# Patient Record
Sex: Female | Born: 1975
Health system: Southern US, Community
[De-identification: ages and names within clinical notes are randomized; demographics above are authoritative.]

## PROBLEM LIST (undated history)

## (undated) DIAGNOSIS — D219 Benign neoplasm of connective and other soft tissue, unspecified: Secondary | ICD-10-CM

## (undated) DIAGNOSIS — D649 Anemia, unspecified: Secondary | ICD-10-CM

---

## 2005-04-30 ENCOUNTER — Other Ambulatory Visit: Admission: RE | Admit: 2005-04-30 | Discharge: 2005-04-30 | Payer: Self-pay | Admitting: Family Medicine

## 2008-08-04 ENCOUNTER — Other Ambulatory Visit: Admission: RE | Admit: 2008-08-04 | Discharge: 2008-08-04 | Payer: Self-pay | Admitting: Obstetrics and Gynecology

## 2008-10-18 ENCOUNTER — Inpatient Hospital Stay (HOSPITAL_COMMUNITY): Admission: AD | Admit: 2008-10-18 | Discharge: 2008-10-18 | Payer: Self-pay | Admitting: Obstetrics and Gynecology

## 2009-01-20 ENCOUNTER — Inpatient Hospital Stay (HOSPITAL_COMMUNITY): Admission: AD | Admit: 2009-01-20 | Discharge: 2009-01-20 | Payer: Self-pay | Admitting: Obstetrics and Gynecology

## 2009-01-21 ENCOUNTER — Inpatient Hospital Stay (HOSPITAL_COMMUNITY): Admission: AD | Admit: 2009-01-21 | Discharge: 2009-01-25 | Payer: Self-pay | Admitting: Obstetrics and Gynecology

## 2009-01-22 ENCOUNTER — Encounter (INDEPENDENT_AMBULATORY_CARE_PROVIDER_SITE_OTHER): Payer: Self-pay | Admitting: Obstetrics and Gynecology

## 2010-09-30 LAB — CBC
MCV: 95.9 fL (ref 78.0–100.0)
Platelets: 148 10*3/uL — ABNORMAL LOW (ref 150–400)
WBC: 17.3 10*3/uL — ABNORMAL HIGH (ref 4.0–10.5)

## 2010-10-01 LAB — COMPREHENSIVE METABOLIC PANEL
BUN: 11 mg/dL (ref 6–23)
Calcium: 9.4 mg/dL (ref 8.4–10.5)
Glucose, Bld: 89 mg/dL (ref 70–99)
Total Protein: 6.1 g/dL (ref 6.0–8.3)

## 2010-10-01 LAB — URINALYSIS, ROUTINE W REFLEX MICROSCOPIC
Ketones, ur: NEGATIVE mg/dL
Nitrite: NEGATIVE
Protein, ur: NEGATIVE mg/dL
pH: 5 (ref 5.0–8.0)

## 2010-10-01 LAB — RPR: RPR Ser Ql: NONREACTIVE

## 2010-10-01 LAB — CBC
HCT: 39.3 % (ref 36.0–46.0)
Hemoglobin: 13.4 g/dL (ref 12.0–15.0)
MCHC: 34.3 g/dL (ref 30.0–36.0)
MCV: 96 fL (ref 78.0–100.0)
RDW: 12.1 % (ref 11.5–15.5)

## 2010-10-01 LAB — LACTATE DEHYDROGENASE: LDH: 190 U/L (ref 94–250)

## 2010-10-04 LAB — URINALYSIS, ROUTINE W REFLEX MICROSCOPIC
Bilirubin Urine: NEGATIVE
Glucose, UA: NEGATIVE mg/dL
Nitrite: NEGATIVE
pH: 6 (ref 5.0–8.0)

## 2010-10-04 LAB — GC/CHLAMYDIA PROBE AMP, GENITAL: Chlamydia, DNA Probe: NEGATIVE

## 2010-10-04 LAB — WET PREP, GENITAL
Trich, Wet Prep: NONE SEEN
Yeast Wet Prep HPF POC: NONE SEEN

## 2010-11-07 NOTE — Op Note (Signed)
NAME:  Tamara Hamilton, Tamara Hamilton NO.:  [id number]   MEDICAL RECORD NO.:  [id number]          PATIENT TYPE:  INP   LOCATION:  9132                          FACILITY:  [facility]   PHYSICIAN:  Gerald Leitz, MD          DATE OF BIRTH:  1975-07-03   DATE OF PROCEDURE:  01/22/2009  DATE OF DISCHARGE:                               OPERATIVE REPORT   PREOPERATIVE DIAGNOSES:  1. 37-week intrauterine pregnancy.  2. Term labor.  3. Failure to progress.   POSTOPERATIVE DIAGNOSES:  1. 37-week intrauterine pregnancy.  2. Term labor.  3. Failure to progress.   PROCEDURE:  Primary low transverse cesarean section.   SURGEON:  Gerald Leitz, MD   ASSISTANT:  None.   ANESTHESIA:  Epidural.   FINDINGS:  Vigorous female infant, cephalic presentation, Apgars per  neonatologist.  Weight 7 pounds 1 ounce.   SPECIMEN:  Placenta.   DISPOSITION OF SPECIMEN:  Pathology.   ESTIMATED BLOOD LOSS:  200 mL.   COMPLICATIONS:  None.   URINE OUTPUT:  Per Anesthesia.   FLUIDS:  Per Anesthesia.   INDICATIONS:  A 35 year old G1 at 37-week intrauterine pregnancy in term  labor with arrest of dilatation at 7-8 cm.   DESCRIPTION OF PROCEDURE:  The patient was taken to the operating room.  Her epidural was found to be adequate.  She was prepped and draped in  the usual sterile fashion.  A Pfannenstiel skin incision was made with  scalpel, carried down to the underlying layer of fascia.  Fascia was  incised in midline and extended laterally with Mayo scissors.  Superior  aspect of fascial incision was elevated with Kocher clamps and  underlying rectus muscles were dissected off.  This was repeated on the  inferior aspect of the fascial incision.  The rectus muscle was  separated in the midline.  The peritoneum was identified and entered  bluntly.  The Alexis retractor was placed.  Vesicouterine peritoneum was  tented up, entered sharply with Metzenbaum scissors.  This was extended  laterally and bladder flap  was created digitally.  Low transverse  incision was made with a scalpel and the infant's head was then  delivered atraumatically.  The mouth and nose were bulb suctioned.  The  rest of the body was delivered.  The cord was clamped x2 and cut.  The  infant was handed off to the awaiting neonatologist.  The placenta was  expressed.  The uterus was exteriorized, cleared of all clot and debris.  The uterine incision repaired with 0 Vicryl in a running locked fashion.  A second layer of suture was used for hemostasis.  The patient also  noted to have extensions of the low transverse incision into the lateral  aspect of the lower uterine segment.  This was also repaired with 0  Vicryl in a running locked fashion.  Excellent hemostasis was noted.  The uterus was returned to the abdomen.  The abdomen was copiously  irrigated, again hemostasis was assured.  The Alexis retractor was  removed.  The peritoneum was closed with 2-0  Vicryl.  The fascia was  closed with 0 PDS in a running fashion and the skin was closed with  staples.  Sponge, lap, and needle counts were correct x2.  900 mg of  clindamycin were given.  A cord was clamped.  The patient was returned  to the recovery room awake and stable condition.      Gerald Leitz, MD  Electronically Signed     TC/MEDQ  D:  01/22/2009  T:  01/23/2009  Job:  [id number]

## 2010-11-10 NOTE — Discharge Summary (Signed)
NAME:  Tamara Hamilton, Tamara Hamilton NO.:  [id number]   MEDICAL RECORD NO.:  [id number]          PATIENT TYPE:  INP   LOCATION:  9132                          FACILITY:  [facility]   PHYSICIAN:  Charles A. Delcambre, MDDATE OF BIRTH:  [birth year]   DATE OF ADMISSION:  01/21/2009  DATE OF DISCHARGE:  01/25/2009                               DISCHARGE SUMMARY   DISCHARGE DIAGNOSES:  1. A 37-week pregnancy.  2. Term labor.  3. Failure to progress with arrest of labor.   PROCEDURE:  Primary low transverse cesarean section.   OPERATIVE FINDINGS:  Vigorous female, Apgars noted to be per  neonatologist.  Apgars were 9 and 9.   DISPOSITION:  The patient is discharged home to follow up in the office  in 1-day to discontinue staples.  She was given convalescent  instructions to notify temperature greater than 100 degrees, increased  bleeding, erythema or drainage about the incision.  She was instructed  to not lift greater than 25 pounds for 1 month, drive for 2 weeks or  bathe for 2 weeks where she can shower.  She is given prescriptions for  Percocet 5/325 one to two p.o. q.4 h. p.r.n. #40 and Motrin 800 mg one  p.o. q.8 h. p.r.n. #30, refill x1.   HISTORY AND PHYSICAL:  Dictated on the chart.   LABORATORY:  Postoperative hematocrit 30.2 and hemoglobin 10.5.   HOSPITAL COURSE:  The patient was admitted and noted to labor.  She did  labor to arrest of dilation at 7-8 cm.  She was taken for cesarean  section postop day #1.  She was doing well and has spontaneous return of  flatus.  Postop day #2 she had good pain control and was tolerating  pain.  She voided and had flatus and was tolerating general diet,  therefore she was discharged on postop day #3 as instructed above.      Charles A. Sydnee Cabal, MD  Electronically Signed     CAD/MEDQ  D:  02/19/2009  T:  02/20/2009  Job:  [id number]

## 2014-12-06 LAB — OB RESULTS CONSOLE ANTIBODY SCREEN: ANTIBODY SCREEN: NEGATIVE

## 2014-12-06 LAB — OB RESULTS CONSOLE GC/CHLAMYDIA
CHLAMYDIA, DNA PROBE: NEGATIVE
Gonorrhea: NEGATIVE

## 2014-12-06 LAB — OB RESULTS CONSOLE PLATELET COUNT: Platelets: 220 10*3/uL

## 2014-12-06 LAB — OB RESULTS CONSOLE HGB/HCT, BLOOD
HCT: 40 %
Hemoglobin: 13.1 g/dL

## 2014-12-06 LAB — OB RESULTS CONSOLE RUBELLA ANTIBODY, IGM: RUBELLA: IMMUNE

## 2014-12-06 LAB — OB RESULTS CONSOLE ABO/RH: RH Type: POSITIVE

## 2014-12-06 LAB — OB RESULTS CONSOLE RPR: RPR: NONREACTIVE

## 2014-12-06 LAB — OB RESULTS CONSOLE HIV ANTIBODY (ROUTINE TESTING): HIV: NONREACTIVE

## 2014-12-06 LAB — OB RESULTS CONSOLE HEPATITIS B SURFACE ANTIGEN: HEP B S AG: NEGATIVE

## 2015-05-11 ENCOUNTER — Inpatient Hospital Stay (HOSPITAL_COMMUNITY): Payer: Medicaid Other | Admitting: Anesthesiology

## 2015-05-11 ENCOUNTER — Inpatient Hospital Stay (HOSPITAL_COMMUNITY): Payer: Medicaid Other

## 2015-05-11 ENCOUNTER — Inpatient Hospital Stay (HOSPITAL_COMMUNITY)
Admission: AD | Admit: 2015-05-11 | Discharge: 2015-05-13 | DRG: 765 | Disposition: A | Discharge status: Home / Self Care | Payer: Medicaid Other | Source: Ambulatory Visit | Attending: Obstetrics and Gynecology | Admitting: Obstetrics and Gynecology

## 2015-05-11 ENCOUNTER — Encounter (HOSPITAL_COMMUNITY): Payer: Self-pay | Admitting: Student

## 2015-05-11 ENCOUNTER — Encounter (HOSPITAL_COMMUNITY)
Admission: AD | Disposition: A | Discharge status: Home / Self Care | Payer: Self-pay | Source: Ambulatory Visit | Attending: Obstetrics and Gynecology

## 2015-05-11 DIAGNOSIS — O34211 Maternal care for low transverse scar from previous cesarean delivery: Principal | ICD-10-CM | POA: Diagnosis present

## 2015-05-11 DIAGNOSIS — O09523 Supervision of elderly multigravida, third trimester: Secondary | ICD-10-CM

## 2015-05-11 DIAGNOSIS — O479 False labor, unspecified: Secondary | ICD-10-CM

## 2015-05-11 DIAGNOSIS — Z3A36 36 weeks gestation of pregnancy: Secondary | ICD-10-CM

## 2015-05-11 DIAGNOSIS — O4700 False labor before 37 completed weeks of gestation, unspecified trimester: Secondary | ICD-10-CM

## 2015-05-11 DIAGNOSIS — Z98891 History of uterine scar from previous surgery: Secondary | ICD-10-CM

## 2015-05-11 HISTORY — DX: Benign neoplasm of connective and other soft tissue, unspecified: D21.9

## 2015-05-11 HISTORY — DX: Anemia, unspecified: D64.9

## 2015-05-11 LAB — GROUP B STREP BY PCR: Group B strep by PCR: NEGATIVE

## 2015-05-11 LAB — RPR: RPR Ser Ql: NONREACTIVE

## 2015-05-11 LAB — TYPE AND SCREEN
ABO/RH(D): O POS
ANTIBODY SCREEN: NEGATIVE

## 2015-05-11 LAB — OB RESULTS CONSOLE GBS: STREP GROUP B AG: NEGATIVE

## 2015-05-11 LAB — CBC
HCT: 38 % (ref 36.0–46.0)
Hemoglobin: 13.1 g/dL (ref 12.0–15.0)
MCH: 32.5 pg (ref 26.0–34.0)
MCHC: 34.5 g/dL (ref 30.0–36.0)
MCV: 94.3 fL (ref 78.0–100.0)
PLATELETS: 177 10*3/uL (ref 150–400)
RBC: 4.03 MIL/uL (ref 3.87–5.11)
RDW: 13.2 % (ref 11.5–15.5)
WBC: 12.7 10*3/uL — AB (ref 4.0–10.5)

## 2015-05-11 LAB — ABO/RH: ABO/RH(D): O POS

## 2015-05-11 LAB — HIV ANTIBODY (ROUTINE TESTING W REFLEX): HIV Screen 4th Generation wRfx: NONREACTIVE

## 2015-05-11 SURGERY — Surgical Case
Anesthesia: Spinal

## 2015-05-11 MED ORDER — NALOXONE HCL 2 MG/2ML IJ SOSY
1.0000 ug/kg/h | PREFILLED_SYRINGE | INTRAMUSCULAR | Status: DC | PRN
  Filled 2015-05-11: qty 2

## 2015-05-11 MED ORDER — NALBUPHINE HCL 10 MG/ML IJ SOLN: 5.0000 mg | Freq: Once | INTRAMUSCULAR | Status: DC | PRN

## 2015-05-11 MED ORDER — DIPHENHYDRAMINE HCL 50 MG/ML IJ SOLN: 12.5000 mg | INTRAMUSCULAR | Status: DC | PRN

## 2015-05-11 MED ORDER — SCOPOLAMINE 1 MG/3DAYS TD PT72
MEDICATED_PATCH | TRANSDERMAL | Status: DC | PRN
  Administered 2015-05-11: 1 via TRANSDERMAL

## 2015-05-11 MED ORDER — NIFEDIPINE 10 MG PO CAPS
20.0000 mg | ORAL_CAPSULE | Freq: Once | ORAL | Status: AC
  Administered 2015-05-11: 20 mg via ORAL

## 2015-05-11 MED ORDER — IBUPROFEN 600 MG PO TABS
600.0000 mg | ORAL_TABLET | Freq: Four times a day (QID) | ORAL | Status: DC
  Administered 2015-05-11 – 2015-05-13 (×8): 600 mg via ORAL
  Filled 2015-05-11 (×8): qty 1

## 2015-05-11 MED ORDER — WITCH HAZEL-GLYCERIN EX PADS: 1.0000 "application " | MEDICATED_PAD | CUTANEOUS | Status: DC | PRN

## 2015-05-11 MED ORDER — MENTHOL 3 MG MT LOZG: 1.0000 | LOZENGE | OROMUCOSAL | Status: DC | PRN

## 2015-05-11 MED ORDER — DIPHENHYDRAMINE HCL 25 MG PO CAPS
25.0000 mg | ORAL_CAPSULE | ORAL | Status: DC | PRN
  Filled 2015-05-11: qty 1

## 2015-05-11 MED ORDER — ONDANSETRON HCL 4 MG/2ML IJ SOLN
INTRAMUSCULAR | Status: DC | PRN
  Administered 2015-05-11: 4 mg via INTRAVENOUS

## 2015-05-11 MED ORDER — SENNOSIDES-DOCUSATE SODIUM 8.6-50 MG PO TABS
2.0000 | ORAL_TABLET | ORAL | Status: DC
  Administered 2015-05-11 – 2015-05-12 (×2): 2 via ORAL
  Filled 2015-05-11 (×2): qty 2

## 2015-05-11 MED ORDER — NIFEDIPINE 10 MG PO CAPS
10.0000 mg | ORAL_CAPSULE | Freq: Once | ORAL | Status: AC
  Administered 2015-05-11: 10 mg via ORAL

## 2015-05-11 MED ORDER — ACETAMINOPHEN 325 MG PO TABS: 650.0000 mg | ORAL_TABLET | ORAL | Status: DC | PRN

## 2015-05-11 MED ORDER — ACETAMINOPHEN 500 MG PO TABS
1000.0000 mg | ORAL_TABLET | Freq: Four times a day (QID) | ORAL | Status: AC
Start: 2015-05-11 — End: 2015-05-12
  Administered 2015-05-11 – 2015-05-12 (×3): 1000 mg via ORAL
  Filled 2015-05-11 (×3): qty 2

## 2015-05-11 MED ORDER — ONDANSETRON HCL 4 MG/2ML IJ SOLN: 4.0000 mg | Freq: Once | INTRAMUSCULAR | Status: DC | PRN

## 2015-05-11 MED ORDER — NALOXONE HCL 0.4 MG/ML IJ SOLN
0.4000 mg | INTRAMUSCULAR | Status: DC | PRN
Start: 2015-05-11 — End: 2015-05-13

## 2015-05-11 MED ORDER — SCOPOLAMINE 1 MG/3DAYS TD PT72: 1.0000 | MEDICATED_PATCH | Freq: Once | TRANSDERMAL | Status: DC

## 2015-05-11 MED ORDER — CEFAZOLIN SODIUM-DEXTROSE 2-3 GM-% IV SOLR
INTRAVENOUS | Status: DC | PRN
  Administered 2015-05-11: 2 g via INTRAVENOUS

## 2015-05-11 MED ORDER — MORPHINE SULFATE (PF) 0.5 MG/ML IJ SOLN
INTRAMUSCULAR | Status: DC | PRN
  Administered 2015-05-11: .2 mg via INTRATHECAL

## 2015-05-11 MED ORDER — NALBUPHINE HCL 10 MG/ML IJ SOLN: 5.0000 mg | INTRAMUSCULAR | Status: DC | PRN

## 2015-05-11 MED ORDER — CITRIC ACID-SODIUM CITRATE 334-500 MG/5ML PO SOLN
30.0000 mL | Freq: Once | ORAL | Status: AC
  Administered 2015-05-11: 30 mL via ORAL
  Filled 2015-05-11: qty 15

## 2015-05-11 MED ORDER — LANOLIN HYDROUS EX OINT: 1.0000 "application " | TOPICAL_OINTMENT | CUTANEOUS | Status: DC | PRN

## 2015-05-11 MED ORDER — ZOLPIDEM TARTRATE 5 MG PO TABS: 5.0000 mg | ORAL_TABLET | Freq: Every evening | ORAL | Status: DC | PRN

## 2015-05-11 MED ORDER — TERBUTALINE SULFATE 1 MG/ML IJ SOLN: 0.2500 mg | Freq: Once | INTRAMUSCULAR | Status: DC

## 2015-05-11 MED ORDER — MAGNESIUM HYDROXIDE 400 MG/5ML PO SUSP
30.0000 mL | ORAL | Status: DC | PRN
Start: 2015-05-11 — End: 2015-05-13

## 2015-05-11 MED ORDER — FENTANYL CITRATE (PF) 100 MCG/2ML IJ SOLN: 25.0000 ug | INTRAMUSCULAR | Status: DC | PRN

## 2015-05-11 MED ORDER — LACTATED RINGERS IV SOLN
INTRAVENOUS | Status: DC
  Administered 2015-05-11: 16:00:00 via INTRAVENOUS

## 2015-05-11 MED ORDER — DIBUCAINE 1 % RE OINT: 1.0000 "application " | TOPICAL_OINTMENT | RECTAL | Status: DC | PRN

## 2015-05-11 MED ORDER — TETANUS-DIPHTH-ACELL PERTUSSIS 5-2.5-18.5 LF-MCG/0.5 IM SUSP: 0.5000 mL | Freq: Once | INTRAMUSCULAR | Status: DC

## 2015-05-11 MED ORDER — BUPIVACAINE IN DEXTROSE 0.75-8.25 % IT SOLN
INTRATHECAL | Status: DC | PRN
  Administered 2015-05-11: 1.6 mL via INTRATHECAL

## 2015-05-11 MED ORDER — OXYCODONE-ACETAMINOPHEN 5-325 MG PO TABS: 1.0000 | ORAL_TABLET | ORAL | Status: DC | PRN

## 2015-05-11 MED ORDER — FENTANYL CITRATE (PF) 100 MCG/2ML IJ SOLN
INTRAMUSCULAR | Status: DC | PRN
  Administered 2015-05-11: 10 ug via INTRATHECAL

## 2015-05-11 MED ORDER — PHENYLEPHRINE 8 MG IN D5W 100 ML (0.08MG/ML) PREMIX OPTIME
INJECTION | INTRAVENOUS | Status: DC | PRN
  Administered 2015-05-11: 60 ug/min via INTRAVENOUS

## 2015-05-11 MED ORDER — MEPERIDINE HCL 25 MG/ML IJ SOLN: 6.2500 mg | INTRAMUSCULAR | Status: DC | PRN

## 2015-05-11 MED ORDER — LACTATED RINGERS IV SOLN
INTRAVENOUS | Status: DC
  Administered 2015-05-11 (×2): via INTRAVENOUS

## 2015-05-11 MED ORDER — ONDANSETRON HCL 4 MG/2ML IJ SOLN: 4.0000 mg | Freq: Three times a day (TID) | INTRAMUSCULAR | Status: DC | PRN

## 2015-05-11 MED ORDER — SODIUM CHLORIDE 0.9 % IJ SOLN: 3.0000 mL | INTRAMUSCULAR | Status: DC | PRN

## 2015-05-11 MED ORDER — MEASLES, MUMPS & RUBELLA VAC ~~LOC~~ INJ
0.5000 mL | INJECTION | Freq: Once | SUBCUTANEOUS | Status: DC
  Filled 2015-05-11: qty 0.5

## 2015-05-11 MED ORDER — PRENATAL MULTIVITAMIN CH
1.0000 | ORAL_TABLET | Freq: Every day | ORAL | Status: DC
  Administered 2015-05-13: 1 via ORAL
  Filled 2015-05-11 (×2): qty 1

## 2015-05-11 MED ORDER — FAMOTIDINE IN NACL 20-0.9 MG/50ML-% IV SOLN
20.0000 mg | Freq: Once | INTRAVENOUS | Status: AC
  Administered 2015-05-11: 20 mg via INTRAVENOUS
  Filled 2015-05-11: qty 50

## 2015-05-11 MED ORDER — LACTATED RINGERS IV BOLUS (SEPSIS)
1000.0000 mL | Freq: Once | INTRAVENOUS | Status: AC
  Administered 2015-05-11: 1000 mL via INTRAVENOUS

## 2015-05-11 MED ORDER — BETAMETHASONE SOD PHOS & ACET 6 (3-3) MG/ML IJ SUSP
12.0000 mg | Freq: Once | INTRAMUSCULAR | Status: AC
  Administered 2015-05-11: 12 mg via INTRAMUSCULAR
  Filled 2015-05-11: qty 2

## 2015-05-11 MED ORDER — DIPHENHYDRAMINE HCL 25 MG PO CAPS: 25.0000 mg | ORAL_CAPSULE | Freq: Four times a day (QID) | ORAL | Status: DC | PRN

## 2015-05-11 MED ORDER — SIMETHICONE 80 MG PO CHEW
80.0000 mg | CHEWABLE_TABLET | ORAL | Status: DC | PRN
  Administered 2015-05-11 – 2015-05-12 (×2): 80 mg via ORAL
  Filled 2015-05-11 (×2): qty 1

## 2015-05-11 MED ORDER — LACTATED RINGERS IV SOLN
40.0000 [IU] | INTRAVENOUS | Status: DC | PRN
  Administered 2015-05-11: 40 [IU] via INTRAVENOUS

## 2015-05-11 MED ORDER — OXYTOCIN 40 UNITS IN LACTATED RINGERS INFUSION - SIMPLE MED: 62.5000 mL/h | INTRAVENOUS | Status: AC

## 2015-05-11 MED ORDER — LACTATED RINGERS IV SOLN
INTRAVENOUS | Status: DC | PRN
  Administered 2015-05-11 (×2): via INTRAVENOUS

## 2015-05-11 SURGICAL SUPPLY — 38 items
BENZOIN TINCTURE PRP APPL 2/3 (GAUZE/BANDAGES/DRESSINGS) ×4 IMPLANT
CLAMP CORD UMBIL (MISCELLANEOUS) IMPLANT
CLOSURE WOUND 1/2 X4 (GAUZE/BANDAGES/DRESSINGS) ×1
CLOTH BEACON ORANGE TIMEOUT ST (SAFETY) ×4 IMPLANT
CONTAINER PREFILL 10% NBF 15ML (MISCELLANEOUS) IMPLANT
DRAPE SHEET LG 3/4 BI-LAMINATE (DRAPES) IMPLANT
DRSG OPSITE POSTOP 4X10 (GAUZE/BANDAGES/DRESSINGS) ×4 IMPLANT
DURAPREP 26ML APPLICATOR (WOUND CARE) ×4 IMPLANT
ELECT REM PT RETURN 9FT ADLT (ELECTROSURGICAL) ×4
ELECTRODE REM PT RTRN 9FT ADLT (ELECTROSURGICAL) ×2 IMPLANT
EXTRACTOR VACUUM KIWI (MISCELLANEOUS) IMPLANT
EXTRACTOR VACUUM M CUP 4 TUBE (SUCTIONS) IMPLANT
EXTRACTOR VACUUM M CUP 4' TUBE (SUCTIONS)
GLOVE BIOGEL PI IND STRL 7.0 (GLOVE) ×2 IMPLANT
GLOVE BIOGEL PI INDICATOR 7.0 (GLOVE) ×2
GLOVE ORTHO TXT STRL SZ7.5 (GLOVE) ×4 IMPLANT
GOWN STRL REUS W/TWL LRG LVL3 (GOWN DISPOSABLE) ×8 IMPLANT
KIT ABG SYR 3ML LUER SLIP (SYRINGE) IMPLANT
NEEDLE HYPO 25X5/8 SAFETYGLIDE (NEEDLE) ×4 IMPLANT
NS IRRIG 1000ML POUR BTL (IV SOLUTION) ×4 IMPLANT
PACK C SECTION WH (CUSTOM PROCEDURE TRAY) ×4 IMPLANT
PAD OB MATERNITY 4.3X12.25 (PERSONAL CARE ITEMS) ×4 IMPLANT
PENCIL SMOKE EVAC W/HOLSTER (ELECTROSURGICAL) ×4 IMPLANT
RTRCTR C-SECT PINK 25CM LRG (MISCELLANEOUS) ×4 IMPLANT
STRIP CLOSURE SKIN 1/2X4 (GAUZE/BANDAGES/DRESSINGS) ×3 IMPLANT
SUT CHROMIC 1 CTX 36 (SUTURE) ×8 IMPLANT
SUT PLAIN 0 NONE (SUTURE) IMPLANT
SUT PLAIN 2 0 (SUTURE) ×2
SUT PLAIN 2 0 XLH (SUTURE) IMPLANT
SUT PLAIN ABS 2-0 CT1 27XMFL (SUTURE) ×2 IMPLANT
SUT VIC AB 0 CT1 27 (SUTURE) ×4
SUT VIC AB 0 CT1 27XBRD ANBCTR (SUTURE) ×4 IMPLANT
SUT VIC AB 2-0 CT1 (SUTURE) ×4 IMPLANT
SUT VIC AB 2-0 CT1 27 (SUTURE) ×2
SUT VIC AB 2-0 CT1 TAPERPNT 27 (SUTURE) ×2 IMPLANT
SUT VIC AB 4-0 KS 27 (SUTURE) IMPLANT
TOWEL OR 17X24 6PK STRL BLUE (TOWEL DISPOSABLE) ×4 IMPLANT
TRAY FOLEY CATH SILVER 14FR (SET/KITS/TRAYS/PACK) ×4 IMPLANT

## 2015-05-11 NOTE — Op Note (Signed)
Preoperative diagnosis: Intrauterine pregnancy at 36+ weeks, previous c-section, preterm labor Postoperative diagnosis: Same Procedure: Repeat low transverse cesarean section without extensions Surgeon: Cheri Fowler M.D. Assistant:  Anson Crofts, RNFA Anesthesia: Spinal  Findings: Patient had significant adhesions to the uterine fundus, I was unable to visualize tubes or ovaries.  She delivered a viable female infant with Apgars of 8 and 9 weight pending Estimated blood loss: 1500 cc Specimens: Placenta sent to labor and delivery Complications: None  Procedure in detail: The patient was taken to the operating room and placed in the sitting position. Dr. Jillyn Hidden instilled spinal anesthesia.  She was then placed in the dorsosupine position with left tilt. Abdomen was then prepped and draped in the usual sterile fashion, and a foley catheter was inserted. The level of her anesthesia was found to be adequate. Abdomen was entered via a standard Pfannenstiel incision through her previous scar.  She had significant adhesions to the uterine fundus, I was able to dissect and create a bladder flap for the uterine incision. A 4 cm transverse incision was then made in the lower uterine segment pushing the bladder inferior. Once the uterine cavity was entered the incision was extended digitally. The fetal vertex was grasped and delivered through the incision atraumatically. Mouth and nares were suctioned. The remainder of the infant then delivered atraumatically. Cord was doubly clamped and cut and the infant handed to the awaiting pediatric team. Cord blood was obtained. The placenta delivered manually. Uterus was wiped dry with clean lap pad and all clots and debris were removed. Uterine incision was inspected and found to be free of extensions, but there was bleeding lateral to the right angle from the uterine artery. Uterine incision was closed in 1 layer with running locking #1 Chromic, starting at each end and  meeting in the middle. Tubes and ovaries were unable to be inspected, nor tubal ligation performed, due to significant adhesions. Uterine incision was inspected and found to be hemostatic. Bleeding from serosal edges was controlled with electrocautery. Subfascial space was irrigated and made hemostatic with electrocautery  Fascia was closed in running fashion starting at both ends and meeting in the middle with 0 Vicryl. Subcutaneous tissue was then irrigated and made hemostatic with electrocautery, then closed with running 2-0 plain gut. Skin was closed with running 4-0 Vicryl subcuticular suture followed by steri-strips and a sterile dressing. Patient tolerated the procedure well and was taken to the recovery in stable condition. Counts were correct x2, she received Ancef 2 g IV at the beginning of the procedure and she had PAS hose on throughout the procedure.

## 2015-05-11 NOTE — Transfer of Care (Signed)
Immediate Anesthesia Transfer of Care Note  Patient: Tamara Hamilton  Procedure(s) Performed: Procedure(s) with comments: CESAREAN SECTION - Unable to perform BTL due to significaqnt scar tissue  Patient Location: PACU  Anesthesia Type:Spinal  Level of Consciousness: awake, alert  and oriented  Airway & Oxygen Therapy: Patient Spontanous Breathing  Post-op Assessment: Report given to RN  Post vital signs: Reviewed  Last Vitals:  Filed Vitals:   05/11/15 0924  BP: 117/46  Pulse: 102  Temp:   Resp:     Complications: No apparent anesthesia complications

## 2015-05-11 NOTE — H&P (Signed)
Tamara Hamilton is a 39 y.o. female, G2 P1001, EGA 36+ weeks with EDC 12-13 presenting for ctx.  Over the past several hours in MAU, she has continued to have ctx q 3-4 min despite 2 doses of PO Procardia, cervix 2-3 cm and has gradually effaced and now has BBOW.  Prenatal care complicated by previous LTCS, declines VBAC and is scheduled for repeat c-section at 39 weeks, wants BTL and has signed papers.  Maternal Medical History:  Reason for admission: Contractions.   Contractions: Frequency: regular.   Perceived severity is moderate.    Fetal activity: Perceived fetal activity is normal.    Prenatal complications: no prenatal complications   OB History    Gravida Para Term Preterm AB TAB SAB Ectopic Multiple Living   2 1 1  0 0 0 0 0 0 1     Past Medical History  Diagnosis Date  . Anemia   . Fibroid    Past Surgical History  Procedure Laterality Date  . Cesarean section     Family History: family history is not on file. Social History:  reports that she has never smoked. She does not have any smokeless tobacco history on file. She reports that she does not drink alcohol or use illicit drugs.   Prenatal Transfer Tool  Maternal Diabetes: No Genetic Screening: Declined Maternal Ultrasounds/Referrals: Normal Fetal Ultrasounds or other Referrals:  None Maternal Substance Abuse:  No Significant Maternal Medications:  None Significant Maternal Lab Results:  None Other Comments:  previous c-section in labor  Review of Systems  Respiratory: Negative.   Cardiovascular: Negative.     Dilation: 2.5 Effacement (%): 100 Station: -2 Exam by:: S. Carrera, RNC Blood pressure 107/45, pulse 97, temperature 98.2 F (36.8 C), temperature source Oral, resp. rate 18, height 5\' 7"  (1.702 m), weight 98.703 kg (217 lb 9.6 oz), SpO2 97 %. Maternal Exam:  Uterine Assessment: Contraction strength is moderate.  Contraction frequency is regular.   Abdomen: Patient reports no abdominal  tenderness. Surgical scars: low transverse.   Estimated fetal weight is 6 1/2 lbs.   Fetal presentation: vertex  Introitus: Normal vulva. Normal vagina.  Amniotic fluid character: not assessed.  Pelvis: adequate for delivery.   Cervix: Cervix evaluated by digital exam.     Fetal Exam Fetal Monitor Review: Mode: ultrasound.   Baseline rate: 140.  Variability: moderate (6-25 bpm).   Pattern: accelerations present and no decelerations.    Fetal State Assessment: Category I - tracings are normal.     Physical Exam  Vitals reviewed. Constitutional: She appears well-developed and well-nourished.  Cardiovascular: Normal rate, regular rhythm and normal heart sounds.   No murmur heard. Respiratory: Effort normal and breath sounds normal. No respiratory distress. She has no wheezes.  GI: Soft.    Prenatal labs: ABO, Rh:  O pos Antibody:  neg Rubella:  Immune RPR:   NR HBsAg:   Neg HIV:   NR GBS:   Not done  Assessment/Plan: IUP at 36+ weeks in early labor, previous c-section and declines VBAC.  Cervix has changed over the past few hours and ctx persist.  C-section procedure and risks discussed, BTL procedure, permanency and failure rate previously discussed, will proceed when OR is ready.   Annaleigh Steinmeyer D 05/11/2015, 9:04 AM

## 2015-05-11 NOTE — Progress Notes (Signed)
UR chart review completed.  

## 2015-05-11 NOTE — MAU Note (Signed)
Per Dr Ulanda Edison, discontinue terbutaline and give 20mg  procardia PO x1 dose and recheck in 1 hour

## 2015-05-11 NOTE — Lactation Note (Signed)
This note was copied from the chart of Tamara Hamilton. Lactation Consultation Note  P2, Ex BF.  BF for 6 months and then pumped for 6 months. Reviewed LPI feeding plan. Mother has already pumped x1.  Encouraged her to pump 4-6 times a day for approx 15 min. Reviewed spoon feeding and hand expression. Baby latched in [place].  Offered to help reposition baby to cross cradle but mother she feels more comfortable in cradle. It took a few attempts to latch baby but he latches easily after a few attempts.  Observed feeding for more than 10 min. Encouraged mother to post pump within the next hour after feeding. Older sibling in room.  Mother distracted understandably during consult due to older child.  FOB on his way to hospital. Discussed milk storage and cleaning.  Brochure given.    Patient Name: Tamara Hamilton Obst [id number] Date: 05/11/2015 Reason for consult: Late preterm infant;Initial assessment   Maternal Data Has patient been taught Hand Expression?: Yes Does the patient have breastfeeding experience prior to this delivery?:  (breastfed for 6 months, then pumped for 6 months)  Feeding Feeding Type: Breast Fed Length of feed: 20 min  LATCH Score/Interventions Latch: Repeated attempts needed to sustain latch, nipple held in mouth throughout feeding, stimulation needed to elicit sucking reflex. Intervention(s): Adjust position;Assist with latch;Breast massage  Audible Swallowing: A few with stimulation  Type of Nipple: Everted at rest and after stimulation  Comfort (Breast/Nipple): Soft / non-tender     Hold (Positioning): Assistance needed to correctly position infant at breast and maintain latch.  LATCH Score: 7  Lactation Tools Discussed/Used Pump Review: Setup, frequency, and cleaning;Milk Storage Date initiated:: 05/11/15   Consult Status Consult Status: Follow-up Date: 05/12/15 Follow-up type: In-patient    Vivianne Master [facility] 05/11/2015, 7:11  PM

## 2015-05-11 NOTE — Anesthesia Postprocedure Evaluation (Signed)
  Anesthesia Post-op Note  Patient: Tamara Hamilton  Procedure(s) Performed: Procedure(s) with comments: CESAREAN SECTION - Unable to perform BTL due to significaqnt scar tissue  Patient Location: Mother/Baby  Anesthesia Type:Spinal  Level of Consciousness: awake, alert , oriented and patient cooperative  Airway and Oxygen Therapy: Patient Spontanous Breathing  Post-op Pain: none  Post-op Assessment: Post-op Vital signs reviewed, Patient's Cardiovascular Status Stable, Respiratory Function Stable, Patent Airway, No headache, No backache and Patient able to bend at knees              Post-op Vital Signs: Reviewed and stable  Last Vitals:  Filed Vitals:   05/11/15 1530  BP: 107/51  Pulse: 77  Temp: 37 C  Resp: 18    Complications: No apparent anesthesia complications

## 2015-05-11 NOTE — MAU Note (Signed)
Pt c/o cramping and contractions about 1 am every 2-5. Denies LOF and vag bleeding. +FM.

## 2015-05-11 NOTE — Consult Note (Signed)
Neonatology Note:   Attendance at C-section:    I was asked by Dr. Willis Modena to attend this elective C/S at 36 1/[redacted] weeks EGA due to previous LTCS as now presenting with contractions.  Good PNC with uncomplicated history.  The mother is a 39 y.o. Female G2 P1001, GBS negative with otherwise reassuring labs. ROM at delivery, fluid clear. Infant vigorous with good spontaneous cry and tone. Needed only minimal bulb suctioning. Ap 8 and 9. Lungs clear to ausc in DR. To CN to care of Pediatrician. Support lactation.   Jerlyn Ly, MD

## 2015-05-11 NOTE — Anesthesia Preprocedure Evaluation (Signed)
Anesthesia Evaluation  Patient identified by MRN, date of birth, ID band Patient awake    Reviewed: Allergy & Precautions, NPO status , Patient's Chart, lab work & pertinent test results  History of Anesthesia Complications Negative for: history of anesthetic complications  Airway Mallampati: II  TM Distance: >3 FB Neck ROM: Full    Dental no notable dental hx. (+) Dental Advisory Given   Pulmonary neg pulmonary ROS,    Pulmonary exam normal breath sounds clear to auscultation       Cardiovascular negative cardio ROS Normal cardiovascular exam Rhythm:Regular Rate:Normal     Neuro/Psych negative neurological ROS  negative psych ROS   GI/Hepatic negative GI ROS, Neg liver ROS,   Endo/Other  obesity  Renal/GU negative Renal ROS  negative genitourinary   Musculoskeletal negative musculoskeletal ROS (+)   Abdominal   Peds negative pediatric ROS (+)  Hematology negative hematology ROS (+)   Anesthesia Other Findings   Reproductive/Obstetrics (+) Pregnancy                             Anesthesia Physical Anesthesia Plan  ASA: II  Anesthesia Plan: Spinal   Post-op Pain Management:    Induction:   Airway Management Planned:   Additional Equipment:   Intra-op Plan:   Post-operative Plan:   Informed Consent: I have reviewed the patients History and Physical, chart, labs and discussed the procedure including the risks, benefits and alternatives for the proposed anesthesia with the patient or authorized representative who has indicated his/her understanding and acceptance.   Dental advisory given  Plan Discussed with:   Anesthesia Plan Comments: (Nonurgent C/S called by Dr. Ewell Poe. I was informed at 908 by MAU nurse. OR called and preparing. I have seen the patient, consented, and ready to proceed.)        Anesthesia Quick Evaluation

## 2015-05-11 NOTE — Anesthesia Procedure Notes (Signed)
Spinal Patient location during procedure: OR Staffing Anesthesiologist: Inioluwa Boulay Performed by: anesthesiologist  Preanesthetic Checklist Completed: patient identified, site marked, surgical consent, pre-op evaluation, timeout performed, IV checked, risks and benefits discussed and monitors and equipment checked Spinal Block Patient position: sitting Prep: DuraPrep Patient monitoring: continuous pulse ox, blood pressure and heart rate Approach: midline Injection technique: single-shot Needle Needle type: Sprotte  Needle gauge: 24 G Needle length: 9 cm Additional Notes Functioning IV was confirmed and monitors were applied. Sterile prep and drape, including hand hygiene, mask and sterile gloves were used. The patient was positioned and the spine was prepped. The skin was anesthetized with lidocaine.  Free flow of clear CSF was obtained prior to injecting local anesthetic into the CSF.  The spinal needle aspirated freely following injection.  The needle was carefully withdrawn.  The patient tolerated the procedure well. Consent was obtained prior to procedure with all questions answered and concerns addressed. Risks including but not limited to bleeding, infection, nerve damage, paralysis, failed block, inadequate analgesia, allergic reaction, high spinal, itching and headache were discussed and the patient wished to proceed.   Lauretta Grill, MD

## 2015-05-11 NOTE — MAU Note (Signed)
Per Dr Ulanda Edison give 0.25 terbutaline subcutaneous and recheck in 1 hour

## 2015-05-12 ENCOUNTER — Encounter (HOSPITAL_COMMUNITY): Payer: Self-pay | Admitting: Obstetrics and Gynecology

## 2015-05-12 LAB — CBC
HCT: 26.1 % — ABNORMAL LOW (ref 36.0–46.0)
Hemoglobin: 9.2 g/dL — ABNORMAL LOW (ref 12.0–15.0)
MCH: 33.3 pg (ref 26.0–34.0)
MCHC: 35.2 g/dL (ref 30.0–36.0)
MCV: 94.6 fL (ref 78.0–100.0)
PLATELETS: 131 10*3/uL — AB (ref 150–400)
RBC: 2.76 MIL/uL — AB (ref 3.87–5.11)
RDW: 13.4 % (ref 11.5–15.5)
WBC: 21.6 10*3/uL — ABNORMAL HIGH (ref 4.0–10.5)

## 2015-05-12 LAB — BIRTH TISSUE RECOVERY COLLECTION (PLACENTA DONATION)

## 2015-05-12 NOTE — Lactation Note (Signed)
This note was copied from the chart of [place]. Lactation Consultation Note  Patient Name: Tamara Hamilton [id number] Date: 05/12/2015 Reason for consult: Follow-up assessment;Late preterm infant Mom reports baby has been sleepy today and not wanting to latch. She has started to supplement with formula. Not receiving any EBM with pumping yet. With suck exam baby has tight mouth, sucks his tongue and is tongue thrusting. Demonstrated jaw massage and suck training. After few attempts baby was able to latch and sustain the latch. Few good suckling bursts but most non-nutritive suckling observed. Slight crease across Mom's nipple when baby came off the breast. Demonstrated to Mom how to pace feed with the bottle with supplement. Advised to supplement with 20 ml of EBM/formula today till baby is waking to BF and more awake at the breast. Mom to post pump every 3 hours for 15 minutes to encourage milk production. Reviewed LPT behaviors. Mom agreeable to plan of BF with each feeding, limiting time at breast to 30 minutes, supplement every 3 hours per LPT guidelines and post pump. Encouraged Mom to call for assist as needed.   Maternal Data    Feeding Feeding Type: Breast Fed Length of feed: 10 min  LATCH Score/Interventions Latch: Repeated attempts needed to sustain latch, nipple held in mouth throughout feeding, stimulation needed to elicit sucking reflex. Intervention(s): Adjust position;Assist with latch;Breast massage;Breast compression  Audible Swallowing: None  Type of Nipple: Everted at rest and after stimulation  Comfort (Breast/Nipple): Soft / non-tender     Hold (Positioning): Assistance needed to correctly position infant at breast and maintain latch. Intervention(s): Breastfeeding basics reviewed;Support Pillows;Position options;Skin to skin  LATCH Score: 6  Lactation Tools Discussed/Used Tools: Pump Breast pump type: Double-Electric Breast Pump   Consult  Status Consult Status: Follow-up Date: 05/13/15 Follow-up type: In-patient    Katrine Coho 05/12/2015, 12:59 PM

## 2015-05-12 NOTE — Progress Notes (Signed)
Subjective: Postpartum Day #1: Cesarean Delivery Patient reports incisional pain, tolerating PO and no problems voiding.    Objective: Vital signs in last 24 hours: Temp:  [97.7 F (36.5 C)-99.6 F (37.6 C)] 98.9 F (37.2 C) (11/17 0430) Pulse Rate:  [51-109] 68 (11/17 0430) Resp:  [17-22] 20 (11/17 0430) BP: (94-129)/(43-70) 103/43 mmHg (11/17 0430) SpO2:  [84 %-99 %] 99 % (11/17 0430)  Physical Exam:  General: alert Lochia: appropriate Uterine Fundus: firm Incision: dressing C/D/I   Recent Labs  05/11/15 0746 05/12/15 0600  HGB 13.1 9.2*  HCT 38.0 26.1*    Assessment/Plan: Status post Cesarean section. Doing well postoperatively.  Continue current care, ambulate.  Alexi Dorminey D 05/12/2015, 7:58 AM

## 2015-05-13 ENCOUNTER — Ambulatory Visit: Payer: Self-pay

## 2015-05-13 MED ORDER — OXYCODONE-ACETAMINOPHEN 5-325 MG PO TABS: 1.0000 | ORAL_TABLET | ORAL | Status: DC | PRN

## 2015-05-13 MED ORDER — IBUPROFEN 600 MG PO TABS: 600.0000 mg | ORAL_TABLET | Freq: Four times a day (QID) | ORAL | Status: AC

## 2015-05-13 NOTE — Discharge Summary (Signed)
    OB Discharge Summary     Patient Name: Tamara Hamilton DOB: 1975-09-28 MRN: [id number]  Date of admission: 05/11/2015 Delivering MD: Willis Modena, Roper Tolson   Date of discharge: 05/13/2015  Admitting diagnosis: 36 WEEKS CTX Intrauterine pregnancy: [redacted]w[redacted]d     Secondary diagnosis:  Active Problems:   S/P cesarean section      Discharge diagnosis: Preterm Pregnancy [place] [facility] course:  Onset of Labor With Unplanned C/S  39 y.o. yo G2P1101 at [redacted]w[redacted]d was admitted in Latent Labor on 05/11/2015.  The patient went for cesarean section due to Elective Repeat, and delivered a Viable infant,05/11/2015  Details of operation can be found in separate operative note. Patient had an uncomplicated postpartum course.  She is ambulating,tolerating a regular diet, passing flatus, and urinating well.  Patient is discharged home in stable condition No discharge date for patient encounter.Marland Kitchen   Physical exam  Filed Vitals:   05/12/15 0430 05/12/15 0850 05/12/15 1837 05/13/15 0609  BP: 103/43 104/55 91/55 107/60  Pulse: 68 69 74 72  Temp: 98.9 F (37.2 C) 98.7 F (37.1 C) 99.4 F (37.4 C) 98.5 F (36.9 C)  TempSrc:  Oral Oral Oral  Resp: 20 18 18 18   Height:      Weight:      SpO2: 99% 99%     General: alert Lochia: appropriate Uterine Fundus: firm Incision: Healing well with no significant drainage  Labs: Lab Results  Component Value Date   WBC 21.6* 05/12/2015   HGB 9.2* 05/12/2015   HCT 26.1* 05/12/2015   MCV 94.6 05/12/2015   PLT 131* 05/12/2015   CMP Latest Ref Rng 01/21/2009  Glucose 70 - 99 mg/dL 89  BUN 6 - 23 mg/dL 11  Creatinine 0.4 - 1.2 mg/dL 0.99  Sodium 135 - 145 mEq/L 135  Potassium 3.5 - 5.1 mEq/L 4.1  Chloride 96 - 112 mEq/L 103  CO2 19 - 32 mEq/L 22  Calcium 8.4 - 10.5 mg/dL 9.4  Total Protein 6.0 - 8.3 g/dL 6.1  Total Bilirubin 0.3 - 1.2 mg/dL 0.7  Alkaline Phos 39 - 117 U/L 124(H)  AST 0 - 37 U/L 26  ALT 0 - 35 U/L 26    Discharge instruction: per  After Visit Summary and "Baby and Me Booklet".  After visit meds:    Medication List    TAKE these medications        ibuprofen 600 MG tablet  Commonly known as:  ADVIL,MOTRIN  Take 1 tablet (600 mg total) by mouth every 6 (six) hours.     oxyCODONE-acetaminophen 5-325 MG tablet  Commonly known as:  PERCOCET/ROXICET  Take 1-2 tablets by mouth every 4 (four) hours as needed for severe pain.     prenatal multivitamin Tabs tablet  Take 1 tablet by mouth daily at 12 noon.        Diet: routine diet  Activity: Advance as tolerated. Pelvic rest for 6 weeks.   Outpatient follow up:2 weeks   Newborn Data: Live born female  Birth Weight: 6 lb 9.1 oz (2980 g) APGAR: 8, 9  Baby Feeding: Breast Disposition:home with mother   05/13/2015 Clarene Duke, MD

## 2015-05-13 NOTE — Discharge Instructions (Signed)
As per discharge pamphlet °

## 2015-05-13 NOTE — Progress Notes (Signed)
POD #2 Doing well, ready to go home Afeb, VSS Abd- soft, fundus firm, incision intact D/c home if baby ok to go

## 2015-05-13 NOTE — Lactation Note (Addendum)
This note was copied from the chart of [facility]. Lactation Consultation Note  Patient Name: Tamara Hamilton [id number] Date: 05/13/2015 Reason for consult: Follow-up assessment;Hyperbilirubinemia;Late preterm infant Mom reports she feels baby is latching well. She has been able to latch since baby has been started on single photo therapy. Baby sleepy at this visit and would not latch. Advised Mom to continue with plan. BF with feeding ques but at least every 3 hours. Supplement with each feeding, increase to 25-30 ml today. Continue to post pump. Mom reports receiving approx 10 ml now with pumping. Mom reports some mild nipple tenderness, comfort gels given with instructions. Encouraged to call for LC to observe latch to be sure baby is obtaining good depth while on bili blanket.    Maternal Data    Feeding Feeding Type: Breast Fed Nipple Type: Slow - flow Length of feed: 0 min  LATCH Score/Interventions Latch: Too sleepy or reluctant, no latch achieved, no sucking elicited. Intervention(s): Adjust position  Audible Swallowing: Spontaneous and intermittent Intervention(s): Hand expression  Type of Nipple: Everted at rest and after stimulation  Comfort (Breast/Nipple): Filling, red/small blisters or bruises, mild/mod discomfort  Problem noted: Mild/Moderate discomfort Interventions (Mild/moderate discomfort): Comfort gels;Hand massage;Hand expression  Hold (Positioning): Assistance needed to correctly position infant at breast and maintain latch. Intervention(s): Position options;Support Pillows;Breastfeeding basics reviewed  LATCH Score: 9  Lactation Tools Discussed/Used Tools: Pump;Comfort gels Breast pump type: Double-Electric Breast Pump   Consult Status Consult Status: Follow-up Date: 05/13/15 Follow-up type: In-patient    Katrine Coho 05/13/2015, 2:48 PM

## 2015-05-31 ENCOUNTER — Inpatient Hospital Stay (HOSPITAL_COMMUNITY)
Admission: RE | Admit: 2015-05-31 | Payer: Medicaid Other | Source: Ambulatory Visit | Admitting: Obstetrics and Gynecology

## 2015-05-31 ENCOUNTER — Encounter (HOSPITAL_COMMUNITY): Admission: RE | Payer: Self-pay | Source: Ambulatory Visit

## 2015-05-31 SURGERY — Surgical Case
Anesthesia: Regional | Laterality: Bilateral

## 2015-07-27 ENCOUNTER — Encounter (HOSPITAL_COMMUNITY): Admission: RE | Payer: Self-pay | Source: Ambulatory Visit

## 2015-07-27 ENCOUNTER — Ambulatory Visit (HOSPITAL_COMMUNITY)
Admission: RE | Admit: 2015-07-27 | Payer: Medicaid Other | Source: Ambulatory Visit | Admitting: Obstetrics and Gynecology

## 2015-07-27 SURGERY — LIGATION, FALLOPIAN TUBE, LAPAROSCOPIC
Anesthesia: General | Laterality: Bilateral

## 2017-09-19 ENCOUNTER — Other Ambulatory Visit: Payer: Self-pay | Admitting: Obstetrics and Gynecology

## 2017-09-19 DIAGNOSIS — R928 Other abnormal and inconclusive findings on diagnostic imaging of breast: Secondary | ICD-10-CM

## 2017-09-26 ENCOUNTER — Ambulatory Visit
Admission: RE | Admit: 2017-09-26 | Discharge: 2017-09-26 | Disposition: A | Discharge status: Home / Self Care | Payer: PRIVATE HEALTH INSURANCE | Source: Ambulatory Visit | Attending: Obstetrics and Gynecology | Admitting: Obstetrics and Gynecology

## 2017-09-26 ENCOUNTER — Ambulatory Visit: Payer: Self-pay

## 2017-09-26 DIAGNOSIS — R928 Other abnormal and inconclusive findings on diagnostic imaging of breast: Secondary | ICD-10-CM

## 2018-06-16 ENCOUNTER — Ambulatory Visit
Admission: RE | Admit: 2018-06-16 | Discharge: 2018-06-16 | Disposition: A | Discharge status: Home / Self Care | Payer: PRIVATE HEALTH INSURANCE | Source: Ambulatory Visit | Attending: Family Medicine | Admitting: Family Medicine

## 2018-06-16 ENCOUNTER — Other Ambulatory Visit: Payer: Self-pay | Admitting: Family Medicine

## 2018-06-16 DIAGNOSIS — M25561 Pain in right knee: Secondary | ICD-10-CM

## 2018-12-19 ENCOUNTER — Other Ambulatory Visit: Payer: Self-pay | Admitting: Internal Medicine

## 2018-12-19 DIAGNOSIS — Z20822 Contact with and (suspected) exposure to covid-19: Secondary | ICD-10-CM

## 2018-12-24 LAB — NOVEL CORONAVIRUS, NAA: SARS-CoV-2, NAA: NOT DETECTED

## 2018-12-30 ENCOUNTER — Other Ambulatory Visit: Payer: Self-pay | Admitting: Family Medicine

## 2018-12-30 DIAGNOSIS — R1084 Generalized abdominal pain: Secondary | ICD-10-CM

## 2019-01-08 ENCOUNTER — Ambulatory Visit
Admission: RE | Admit: 2019-01-08 | Discharge: 2019-01-08 | Disposition: A | Discharge status: Home / Self Care | Payer: PRIVATE HEALTH INSURANCE | Source: Ambulatory Visit | Attending: Family Medicine | Admitting: Family Medicine

## 2019-01-08 DIAGNOSIS — R1084 Generalized abdominal pain: Secondary | ICD-10-CM

## 2019-08-10 ENCOUNTER — Other Ambulatory Visit: Payer: Self-pay

## 2019-08-10 ENCOUNTER — Ambulatory Visit: Payer: PRIVATE HEALTH INSURANCE | Attending: Internal Medicine

## 2019-08-10 DIAGNOSIS — Z20822 Contact with and (suspected) exposure to covid-19: Secondary | ICD-10-CM | POA: Diagnosis not present

## 2019-08-11 LAB — NOVEL CORONAVIRUS, NAA: SARS-CoV-2, NAA: NOT DETECTED

## 2019-11-16 DIAGNOSIS — S61211A Laceration without foreign body of left index finger without damage to nail, initial encounter: Secondary | ICD-10-CM | POA: Diagnosis not present

## 2019-12-25 DIAGNOSIS — E559 Vitamin D deficiency, unspecified: Secondary | ICD-10-CM | POA: Diagnosis not present

## 2019-12-25 DIAGNOSIS — Z5181 Encounter for therapeutic drug level monitoring: Secondary | ICD-10-CM | POA: Diagnosis not present

## 2019-12-25 DIAGNOSIS — Z13220 Encounter for screening for lipoid disorders: Secondary | ICD-10-CM | POA: Diagnosis not present

## 2019-12-25 DIAGNOSIS — Z0000 Encounter for general adult medical examination without abnormal findings: Secondary | ICD-10-CM | POA: Diagnosis not present

## 2020-03-04 DIAGNOSIS — M25462 Effusion, left knee: Secondary | ICD-10-CM | POA: Diagnosis not present

## 2020-03-16 DIAGNOSIS — M25562 Pain in left knee: Secondary | ICD-10-CM | POA: Diagnosis not present

## 2020-03-31 ENCOUNTER — Other Ambulatory Visit: Payer: Self-pay

## 2020-03-31 ENCOUNTER — Ambulatory Visit: Payer: Self-pay

## 2020-03-31 ENCOUNTER — Ambulatory Visit (INDEPENDENT_AMBULATORY_CARE_PROVIDER_SITE_OTHER): Payer: BC Managed Care – PPO | Admitting: Family Medicine

## 2020-03-31 ENCOUNTER — Encounter: Payer: Self-pay | Admitting: Family Medicine

## 2020-03-31 VITALS — BP 120/80 | HR 67 | Ht 67.0 in | Wt 209.4 lb

## 2020-03-31 DIAGNOSIS — G8929 Other chronic pain: Secondary | ICD-10-CM | POA: Diagnosis not present

## 2020-03-31 DIAGNOSIS — M25562 Pain in left knee: Secondary | ICD-10-CM

## 2020-03-31 DIAGNOSIS — M25462 Effusion, left knee: Secondary | ICD-10-CM | POA: Diagnosis not present

## 2020-03-31 DIAGNOSIS — M25469 Effusion, unspecified knee: Secondary | ICD-10-CM | POA: Diagnosis not present

## 2020-03-31 MED ORDER — PENNSAID 2 % EX SOLN: 1.0000 "application " | Freq: Two times a day (BID) | CUTANEOUS | 2 refills | Status: AC

## 2020-03-31 NOTE — Addendum Note (Signed)
Addended by: Cresenciano Lick on: 03/31/2020 09:37 AM   Modules accepted: Orders

## 2020-03-31 NOTE — Patient Instructions (Addendum)
Thank you for coming in today.  Pennsaid instructions: You have been given a sample/prescription for Pennsaid, a topical medication.     You are to apply this gel to your injured body part twice daily (morning and evening).   A little goes a long way so you can use about a pea-sized amount for each area.   Spread this small amount over the area into a thin film and let it dry.   Be sure that you do not rub the gel into your skin for more than 10 or 15 seconds otherwise it can irritate you skin.    Once you apply the gel, please do not put any other lotion or clothing in contact with that area for 30 minutes to allow the gel to absorb into your skin.   Some people are sensitive to the medication and can develop a sunburn-like rash.  If you have only mild symptoms it is okay to continue to use the medication but if you have any breakdown of your skin you should discontinue its use and please let us know.   If you have been written a prescription for Pennsaid, you will receive a pump bottle of this topical gel through a mail order pharmacy.  The instructions on the bottle will say to apply two pumps twice a day which may be too much gel for your particular area so use the pea-sized amount as your guide. Instructions for Duexis, Pennsaid and Vimovo:  Your prescription will be filled through a participating [facility] mail order pharmacy.  You will receive a phone call or text from one of the participating pharmacies which can be located in any state in the [place].  You must communicate directly with them to have this medication filled.  When the pharmacy contacts you, they will need your mailing address (for shipment of the medication) andy they will need payment information if you have a copay (typically no more than $10). If you have not heard from them 2-3 days after your appointment with Dr. Georgina Snell, contact [facility] directly at [phone].   Please use voltaren gel up to 4x  daily for pain as needed.   Call or go to the ER if you develop a large red swollen joint with extreme pain or oozing puss.

## 2020-03-31 NOTE — Progress Notes (Signed)
Subjective:    I'm seeing this patient as a consultation for:  Dr. Justin Mend. Note will be routed back to referring provider/PCP.  CC: L knee pain and swelling  I, Molly Weber, LAT, ATC, am serving as scribe for Dr. Lynne Leader.  HPI: Pt is a 44 y/o female presenting w/ c/o chronic B knee pain w/ worsening L knee pain and swelling over the last 6 weeks.  She locates her pain to her entire L knee, including her L post knee.  She notes knee swelling and difficulty bending.  She notes some grinding and clicking no locking or catching.  Pain is interfering with her ability to walk normally and stand for prolonged period of time.  She also has difficulty getting in and out of her car.  No family or personal history of rheumatologic disease or gout.  Radiating pain: yes into her L lower leg Knee swelling: yes Knee mechanical symptoms: yes Aggravating factors: walking; weight bearing activity; prolonged standing Treatments tried: ice; IBU; Naproxen  Diagnostic imaging: R and L knee XR- 06/16/18  Past medical history, Surgical history, Family history, Social history, Allergies, and medications have been entered into the medical record, reviewed.   Review of Systems: No new headache, visual changes, nausea, vomiting, diarrhea, constipation, dizziness, abdominal pain, skin rash, fevers, chills, night sweats, weight loss, swollen lymph nodes, body aches, joint swelling, muscle aches, chest pain, shortness of breath, mood changes, visual or auditory hallucinations.   Objective:    Vitals:   03/31/20 0822  BP: 120/80  Pulse: 67  SpO2: 98%   General: Well Developed, well nourished, and in no acute distress.  Neuro/Psych: Alert and oriented x3, extra-ocular muscles intact, able to move all 4 extremities, sensation grossly intact. Skin: Warm and dry, no rashes noted.  Respiratory: Not using accessory muscles, speaking in full sentences, trachea midline.  Cardiovascular: Pulses palpable, no extremity  edema. Abdomen: Does not appear distended. MSK: Left knee large effusion otherwise normal-appearing with no erythema.  Range of motion 0-110 degrees with mild crepitation. Tender palpation medial joint line. Stable ligamentous exam. Negative McMurray's test. Intact strength.  Right knee small effusion otherwise normal with no erythema. Range of motion full with mild crepitation. Tender palpation medial joint line. Stable ligamentous exam. Negative McMurray's test. Intact strength.  Lab and Radiology Results  Procedure: Real-time Ultrasound Guided aspiration and injection of left knee superior lateral patellar space Device: Philips Affiniti 50G Images permanently stored and available for review in PACS Ultrasound examination of left knee prior to injection reveals a large effusion and DJD with degenerative appearing meniscus predominantly medial aspect.   No Baker's cyst visible. Verbal informed consent obtained.  Discussed risks and benefits of procedure. Warned about infection bleeding damage to structures skin hypopigmentation and fat atrophy among others. Patient expresses understanding and agreement Time-out conducted.   Noted no overlying erythema, induration, or other signs of local infection.   Skin prepped in a sterile fashion.   Local anesthesia: Topical Ethyl chloride.   With sterile technique and under real time ultrasound guidance:  3 mL of lidocaine injected subcutaneously achieving good anesthesia. Skin again sterilized with isopropyl alcohol and 18-gauge needle used to access the effusion.  65 mL of cloudy light red-tinged straw-colored colored fluid aspirated.  Syringe was exchanged and 40 mg of Kenalog and 2 mL of Marcaine injected into joint.  Knee joint visibly decompressed on ultrasound and physical examination following aspiration.   Completed without difficulty   Pain immediately resolved suggesting  accurate placement of the medication.   Advised to call if  fevers/chills, erythema, induration, drainage, or persistent bleeding.   Images permanently stored and available for review in the ultrasound unit.  Impression: Technically successful ultrasound guided injection.   EXAM: LEFT KNEE - 1-2 VIEW  COMPARISON:  None.  FINDINGS: No fracture or malalignment. Mild patellofemoral degenerative change. Trace knee effusion.  IMPRESSION: Mild patellofemoral degenerative change with trace knee effusion   Electronically Signed   By: Donavan Foil M.D.   On: 06/16/2018 14:54 I, Lynne Leader, personally (independently) visualized and performed the interpretation of the images attached in this note. Mild DJD    Impression and Recommendations:    Assessment and Plan: 44 y.o. female with left knee pain due to DJD likely.  Patient also has an effusion on exam today.  There may also be a component of degenerative meniscus tear.  She is already failing typical conservative management.  Plan for trial of topical diclofenac NSAID versus Voltaren gel and aspiration and injection today as well as quad strengthening and weight loss.  The aspirated fluid is a bit cloudy will send for culture and cell count differential and crystal analysis.  Recheck back with me as needed.Marland Kitchen  PDMP not reviewed this encounter. Orders Placed This Encounter  Procedures  . Anaerobic and Aerobic Culture    Source left knee effusion    Standing Status:   Future    Standing Expiration Date:   03/31/2021  . [place] LIMITED JOINT SPACE STRUCTURES LOW LEFT(NO LINKED CHARGES)    Order Specific Question:   Reason for Exam (SYMPTOM  OR DIAGNOSIS REQUIRED)    Answer:   L knee pain    Order Specific Question:   Preferred imaging location?    Answer:   [place]  . Synovial cell count + diff, w/ crystals    Left knee effusion    Standing Status:   Future    Standing Expiration Date:   03/31/2021   Meds ordered this encounter  Medications  . Diclofenac  Sodium (PENNSAID) 2 % SOLN    Sig: Place 1 application onto the skin 2 (two) times daily.    Dispense:  112 g    Refill:  2    Home Phone      [phone] Mobile          [phone]     Discussed warning signs or symptoms. Please see discharge instructions. Patient expresses understanding.   The above documentation has been reviewed and is accurate and complete Lynne Leader, M.D.

## 2020-04-02 LAB — CELL COUNT + DIFF, W/O CRYST-SYNVL FLD: Eosinophils-Synovial: 0 % (ref 0–2)

## 2020-04-04 NOTE — Progress Notes (Signed)
Fluid culture from knee is negative with no infection.

## 2020-04-05 DIAGNOSIS — Z1231 Encounter for screening mammogram for malignant neoplasm of breast: Secondary | ICD-10-CM | POA: Diagnosis not present

## 2020-04-05 DIAGNOSIS — Z130 Encounter for screening for diseases of the blood and blood-forming organs and certain disorders involving the immune mechanism: Secondary | ICD-10-CM | POA: Diagnosis not present

## 2020-04-05 DIAGNOSIS — Z01419 Encounter for gynecological examination (general) (routine) without abnormal findings: Secondary | ICD-10-CM | POA: Diagnosis not present

## 2020-04-05 DIAGNOSIS — Z1151 Encounter for screening for human papillomavirus (HPV): Secondary | ICD-10-CM | POA: Diagnosis not present

## 2020-04-05 DIAGNOSIS — Z124 Encounter for screening for malignant neoplasm of cervix: Secondary | ICD-10-CM | POA: Diagnosis not present

## 2020-04-06 DIAGNOSIS — Z1151 Encounter for screening for human papillomavirus (HPV): Secondary | ICD-10-CM | POA: Diagnosis not present

## 2020-04-06 DIAGNOSIS — Z124 Encounter for screening for malignant neoplasm of cervix: Secondary | ICD-10-CM | POA: Diagnosis not present

## 2020-04-06 LAB — CELL COUNT + DIFF, W/O CRYST-SYNVL FLD
Basophils, %: 0 %
Lymphocytes-Synovial Fld: 11 % (ref 0–74)
Monocyte/Macrophage: 70 % — ABNORMAL HIGH (ref 0–69)
Neutrophil, Synovial: 12 % (ref 0–24)
Synoviocytes, %: 7 % (ref 0–15)
WBC, Synovial: 797 cells/uL — ABNORMAL HIGH (ref <150)

## 2020-04-06 LAB — SYNOVIAL FLUID, CRYSTAL

## 2020-04-06 LAB — ANAEROBIC AND AEROBIC CULTURE
AER RESULT:: NO GROWTH
GRAM STAIN:: NONE SEEN
MICRO NUMBER:: 11045592
MICRO NUMBER:: 11045593
SPECIMEN QUALITY:: ADEQUATE
SPECIMEN QUALITY:: ADEQUATE

## 2020-04-13 ENCOUNTER — Other Ambulatory Visit: Payer: Self-pay | Admitting: Obstetrics and Gynecology

## 2020-04-13 DIAGNOSIS — N6489 Other specified disorders of breast: Secondary | ICD-10-CM

## 2020-04-20 ENCOUNTER — Other Ambulatory Visit: Payer: Self-pay

## 2020-04-20 ENCOUNTER — Ambulatory Visit (INDEPENDENT_AMBULATORY_CARE_PROVIDER_SITE_OTHER): Payer: BC Managed Care – PPO | Admitting: Family Medicine

## 2020-04-20 ENCOUNTER — Ambulatory Visit (INDEPENDENT_AMBULATORY_CARE_PROVIDER_SITE_OTHER): Payer: BC Managed Care – PPO

## 2020-04-20 ENCOUNTER — Encounter: Payer: Self-pay | Admitting: Family Medicine

## 2020-04-20 VITALS — BP 116/80 | HR 83 | Ht 67.0 in | Wt 210.2 lb

## 2020-04-20 DIAGNOSIS — M25462 Effusion, left knee: Secondary | ICD-10-CM

## 2020-04-20 DIAGNOSIS — M25562 Pain in left knee: Secondary | ICD-10-CM

## 2020-04-20 DIAGNOSIS — G8929 Other chronic pain: Secondary | ICD-10-CM | POA: Diagnosis not present

## 2020-04-20 NOTE — Progress Notes (Signed)
I, Wendy Poet, LAT, ATC, am serving as scribe for Dr. Lynne Leader.  Tamara Hamilton is a 44 y.o. female who presents to [place] at [facility] today for f/u of L knee pain and swelling.  She was last seen by Dr. Georgina Snell on 03/31/20 and had a L knee injection.  She was also prescribed Pennsaid.  Since her last visit, pt reports con't L knee pain.  She states that the L knee injection helped for a few weeks but the pain and swelling is returning.  It is not as bad as it was previously but is headed back to that prior level.  Pain is interfering with quality of life and ability to exercise and walk normally and squat down to perform normal activities of daily living at home such as cleaning and dressing.  Diagnostic imaging: L knee XR- 06/16/18   Pertinent review of systems: No fevers or chills  Relevant historical information: Status post cesarean section   Exam:  BP 116/80 (BP Location: Right Arm, Patient Position: Sitting, Cuff Size: Large)   Pulse 83   Ht 5\' 7"  (1.702 m)   Wt 210 lb 3.2 oz (95.3 kg)   LMP 04/02/2020   SpO2 98%   BMI 32.92 kg/m  General: Well Developed, well nourished, and in no acute distress.   MSK: Left knee large effusion normal-appearing otherwise. Normal motion with crepitation. Tender palpation medial joint line. Positive medial McMurray's test. Stable ligamentous exam.    Lab and Radiology Results  X-ray images left knee obtained today personally and independently interpreted. Moderate medial compartment DJD mild patellofemoral DJD.  No acute fractures. Await formal radiology review    Assessment and Plan: 44 y.o. female with left knee pain and effusion and mechanical symptoms thought to be due to exacerbation of DJD and also probable medial meniscus tear.  Patient had dedicated trial of injection 2 weeks ago which did not last very long at all but did provide pain relief.  At this point I do not think further conservative  management is going to help all that much.  Given the significant interference with her quality of life and failure of conservative management we will proceed with MRI for surgical planning.   PDMP not reviewed this encounter. Orders Placed This Encounter  Procedures  . DG Knee AP/LAT W/Sunrise Left    Standing Status:   Future    Number of Occurrences:   1    Standing Expiration Date:   04/20/2021    Order Specific Question:   Reason for Exam (SYMPTOM  OR DIAGNOSIS REQUIRED)    Answer:   eval left knee pain    Order Specific Question:   Is patient pregnant?    Answer:   No    Order Specific Question:   Preferred imaging location?    Answer:   [facility]  . MR Knee Left  Wo Contrast    Standing Status:   Future    Standing Expiration Date:   04/20/2021    Order Specific Question:   What is the patient's sedation requirement?    Answer:   No Sedation    Order Specific Question:   Does the patient have a pacemaker or implanted devices?    Answer:   No    Order Specific Question:   Preferred imaging location?    Answer:   [facility] (table limit-350lbs)   No orders of the defined types were placed in this encounter.  Discussed warning signs or symptoms. Please see discharge instructions. Patient expresses understanding.   The above documentation has been reviewed and is accurate and complete Lynne Leader, M.D.

## 2020-04-20 NOTE — Patient Instructions (Signed)
Thank you for coming in today.  Please get an Xray today before you leave  You should hear from MRI scheduling within 1 week. If you do not hear please let me know.   Recheck following MRI.

## 2020-04-24 ENCOUNTER — Ambulatory Visit (INDEPENDENT_AMBULATORY_CARE_PROVIDER_SITE_OTHER): Payer: BC Managed Care – PPO

## 2020-04-24 ENCOUNTER — Other Ambulatory Visit: Payer: Self-pay

## 2020-04-24 DIAGNOSIS — M949 Disorder of cartilage, unspecified: Secondary | ICD-10-CM

## 2020-04-24 DIAGNOSIS — G8929 Other chronic pain: Secondary | ICD-10-CM

## 2020-04-24 DIAGNOSIS — M25462 Effusion, left knee: Secondary | ICD-10-CM | POA: Diagnosis not present

## 2020-04-24 DIAGNOSIS — M25562 Pain in left knee: Secondary | ICD-10-CM | POA: Diagnosis not present

## 2020-04-24 DIAGNOSIS — S83242A Other tear of medial meniscus, current injury, left knee, initial encounter: Secondary | ICD-10-CM | POA: Diagnosis not present

## 2020-04-25 NOTE — Progress Notes (Signed)
X-ray knee shows mild arthritis with a lot of fluid in the knee joint

## 2020-04-25 NOTE — Progress Notes (Signed)
MRI knee shows large medial meniscus tear. It also shows area of arthritis in the knee joint with bone irritation. We will discuss this in detail at scheduled follow up with me on 11/3

## 2020-04-27 ENCOUNTER — Encounter: Payer: Self-pay | Admitting: Family Medicine

## 2020-04-27 ENCOUNTER — Ambulatory Visit (INDEPENDENT_AMBULATORY_CARE_PROVIDER_SITE_OTHER): Payer: BC Managed Care – PPO | Admitting: Family Medicine

## 2020-04-27 ENCOUNTER — Other Ambulatory Visit: Payer: Self-pay

## 2020-04-27 VITALS — BP 110/78 | HR 75 | Ht 67.0 in | Wt 210.0 lb

## 2020-04-27 DIAGNOSIS — G8929 Other chronic pain: Secondary | ICD-10-CM | POA: Diagnosis not present

## 2020-04-27 DIAGNOSIS — M25562 Pain in left knee: Secondary | ICD-10-CM | POA: Diagnosis not present

## 2020-04-27 DIAGNOSIS — M25462 Effusion, left knee: Secondary | ICD-10-CM

## 2020-04-27 DIAGNOSIS — S83232D Complex tear of medial meniscus, current injury, left knee, subsequent encounter: Secondary | ICD-10-CM

## 2020-04-27 DIAGNOSIS — S83232A Complex tear of medial meniscus, current injury, left knee, initial encounter: Secondary | ICD-10-CM | POA: Insufficient documentation

## 2020-04-27 NOTE — Patient Instructions (Signed)
Thank you for coming in today.  Plan for surgical consultation.  You should hear soon about scheduling.  Recheck with me as needed.  Could consider gel shots in the future if needed.

## 2020-04-27 NOTE — Progress Notes (Signed)
I, Wendy Poet, LAT, ATC, am serving as scribe for Dr. Lynne Leader.  Tamara Hamilton is a 44 y.o. female who presents to [place] at [facility] today for f/u of L knee pain and L knee MRI review.  She was last seen by Dr. Georgina Snell on 04/20/20 and noted con't/worsening L knee pain and was referred for a L knee MRI.  She had a prior L knee injection on 03/31/20 that helped for about 2 weeks.  Since her last visit, pt reports no change in her L knee since her last visit.  Diagnostic testing: L knee MRI- 04/24/20; L knee XR- 03/31/20   Pertinent review of systems: No fever or chills  Relevant historical information: hx C-section.   Exam:  BP 110/78 (BP Location: Right Arm, Patient Position: Sitting, Cuff Size: Large)   Pulse 75   Ht 5\' 7"  (1.702 m)   Wt 210 lb (95.3 kg)   LMP 04/02/2020   SpO2 99%   BMI 32.89 kg/m  General: Well Developed, well nourished, and in no acute distress.   MSK: Left knee normal gait    Lab and Radiology Results No results found for this or any previous visit (from the past 72 hour(s)). MR Knee Left  Wo Contrast  Result Date: 04/25/2020 CLINICAL DATA:  Left knee pain for 2 months. EXAM: MRI OF THE LEFT KNEE WITHOUT CONTRAST TECHNIQUE: Multiplanar, multisequence MR imaging of the knee was performed. No intravenous contrast was administered. COMPARISON:  None. FINDINGS: MENISCI Medial: Large radial tear of the posterior horn of the medial meniscus with peripheral meniscal extrusion. Complex tear of the body of the medial meniscus. Lateral: Intact. LIGAMENTS Cruciates: ACL and PCL are intact. Collaterals: Medial collateral ligament is intact. Lateral collateral ligament complex is intact. CARTILAGE Patellofemoral: Cartilage fissuring of the lateral patellar facet. Partial-thickness cartilage loss of the trochlear groove. Medial: High-grade partial-thickness cartilage loss of the medial femorotibial compartment with subchondral reactive marrow edema.  Lateral:  No chondral defect. JOINT: Large joint effusion. Normal Hoffa's fat-pad. No plical thickening. POPLITEAL FOSSA: Popliteus tendon is intact. No Baker's cyst. EXTENSOR MECHANISM: Intact quadriceps tendon. Intact patellar tendon. Intact lateral patellar retinaculum. Intact medial patellar retinaculum. Intact MPFL. BONES: No aggressive osseous lesion. No fracture or dislocation. Other: No fluid collection or hematoma. Muscles are normal. IMPRESSION: 1. Large radial tear of the posterior horn of the medial meniscus with peripheral meniscal extrusion. Complex tear of the body of the medial meniscus. 2. High-grade partial-thickness cartilage loss of the medial femorotibial compartment with subchondral reactive marrow edema. 3. Cartilage fissuring of the lateral patellar facet. Partial-thickness cartilage loss of the trochlear groove. 4. Large joint effusion. Electronically Signed   By: Kathreen Devoid   On: 04/25/2020 09:52   I, Lynne Leader, personally (independently) visualized and performed the interpretation of the images attached in this note.     Assessment and Plan: 44 y.o. female with left knee pain with mechanical symptoms due to medial meniscus tear and medial DJD.  Patient has had trial of conservative management including steroid injection with very little benefit.  I think surgical consultation is reasonable next step.  Discussed that surgery will likely not resolve all of her pain but should help with the mechanical symptoms and a moderate amount of her pain.  If she still has pain following meniscus debridement she may be a good candidate for gel shots or even repeat steroid injection as some of the underlying issue has been addressed at that point.  Discussed that it is likely that she will need a knee replacement at some point in future however would like to wait until her 64s if possible.  Referral placed to orthopedic surgery for surgical consultation.  Recheck back with me as  needed.   PDMP not reviewed this encounter. Orders Placed This Encounter  Procedures  . Ambulatory referral to Orthopedic Surgery    Referral Priority:   Routine    Referral Type:   Surgical    Referral Reason:   Specialty Services Required    Requested Specialty:   Orthopedic Surgery    Number of Visits Requested:   1   No orders of the defined types were placed in this encounter.    Discussed warning signs or symptoms. Please see discharge instructions. Patient expresses understanding.   The above documentation has been reviewed and is accurate and complete Lynne Leader, M.D.  Total encounter time 20 minutes including face-to-face time with the patient and, reviewing past medical record, and charting on the date of service.   MRI results and plan.

## 2020-04-28 ENCOUNTER — Other Ambulatory Visit: Payer: BC Managed Care – PPO

## 2020-05-04 ENCOUNTER — Other Ambulatory Visit: Payer: Self-pay

## 2020-05-04 ENCOUNTER — Ambulatory Visit (INDEPENDENT_AMBULATORY_CARE_PROVIDER_SITE_OTHER): Payer: BC Managed Care – PPO | Admitting: Orthopaedic Surgery

## 2020-05-04 ENCOUNTER — Encounter: Payer: Self-pay | Admitting: Orthopaedic Surgery

## 2020-05-04 DIAGNOSIS — M84469A Pathological fracture, unspecified tibia and fibula, initial encounter for fracture: Secondary | ICD-10-CM

## 2020-05-04 DIAGNOSIS — S83232D Complex tear of medial meniscus, current injury, left knee, subsequent encounter: Secondary | ICD-10-CM

## 2020-05-04 NOTE — Progress Notes (Signed)
Office Visit Note   Patient: Tamara Hamilton           Date of Birth: 1976-01-30           MRN: [id number] Visit Date: 05/04/2020              Requested by: Gregor Hams, MD [place],  [place] [address] PCP: [facility]   Assessment & Plan: Visit Diagnoses:  1. Complex tear of medial meniscus of left knee as current injury, subsequent encounter   2. Insufficiency fracture of tibia, initial encounter     Plan: MRI shows a large complex tear the posterior horn the medial meniscus with extrusion and insufficiency fracture of the medial tibial plateau.  She has high-grade chondromalacia of the medial compartment as well.  These findings were all reviewed and discussed in detail with the patient and her husband and recommendation has been made for arthroscopic partial medial meniscectomy versus repair and a subchondroplasty of the medial tibial plateau.  Risk benefits rehab recovery reviewed with the patient detail.  Denies history of DVT.  Questions encouraged and answered.  We look forward to treating her in the operating room.  Follow-Up Instructions: Return for 10 day postop visit.   Orders:  No orders of the defined types were placed in this encounter.  No orders of the defined types were placed in this encounter.     Procedures: No procedures performed   Clinical Data: No additional findings.   Subjective: Chief Complaint  Patient presents with  . Left Knee - Pain    Ms. Ungaro is a 44 year old female referral from Dr. Georgina Snell for chronic left knee pain with recent MRI showing a complex medial meniscal tear and insufficiency fracture medial tibial plateau.  Briefly she has had pain since August of this year without any known injuries or trauma.  She has mainly medial sided knee pain.  She has tried cortisone injection which only helped for 2 weeks and she uses Pennsaid gel as well.  She has been using Tylenol and ibuprofen occasionally.   Denies any mechanical symptoms but does endorse severe pain.   Review of Systems  Constitutional: Negative.   HENT: Negative.   Eyes: Negative.   Respiratory: Negative.   Cardiovascular: Negative.   Endocrine: Negative.   Musculoskeletal: Negative.   Neurological: Negative.   Hematological: Negative.   Psychiatric/Behavioral: Negative.   All other systems reviewed and are negative.    Objective: Vital Signs: There were no vitals taken for this visit.  Physical Exam Vitals and nursing note reviewed.  Constitutional:      Appearance: She is well-developed.  HENT:     Head: Normocephalic and atraumatic.  Pulmonary:     Effort: Pulmonary effort is normal.  Abdominal:     Palpations: Abdomen is soft.  Musculoskeletal:     Cervical back: Neck supple.  Skin:    General: Skin is warm.     Capillary Refill: Capillary refill takes less than 2 seconds.  Neurological:     Mental Status: She is alert and oriented to person, place, and time.  Psychiatric:        Behavior: Behavior normal.        Thought Content: Thought content normal.        Judgment: Judgment normal.     Ortho Exam Left knee shows moderate joint effusion.  Significant tenderness along the medial joint line and the medial tibial plateau.  Pain with range  of motion.  Close and cruciates are stable. Specialty Comments:  No specialty comments available.  Imaging: No results found.   PMFS History: Patient Active Problem List   Diagnosis Date Noted  . Insufficiency fracture of tibia 05/04/2020  . Complex tear of medial meniscus of left knee as current injury 04/27/2020  . S/P cesarean section 05/11/2015   Past Medical History:  Diagnosis Date  . Anemia   . Fibroid     History reviewed. No pertinent family history.  Past Surgical History:  Procedure Laterality Date  . CESAREAN SECTION    . CESAREAN SECTION WITH BILATERAL TUBAL LIGATION  05/11/2015   Procedure: CESAREAN SECTION;  Surgeon: Cheri Fowler, MD;  Location: [facility];  Service: Obstetrics;;  Unable to perform BTL due to significaqnt scar tissue   Social History   Occupational History  . Not on file  Tobacco Use  . Smoking status: Never Smoker  . Smokeless tobacco: Never Used  Substance and Sexual Activity  . Alcohol use: No  . Drug use: No  . Sexual activity: Yes

## 2020-05-24 ENCOUNTER — Other Ambulatory Visit: Payer: Self-pay | Admitting: Obstetrics and Gynecology

## 2020-05-24 DIAGNOSIS — R928 Other abnormal and inconclusive findings on diagnostic imaging of breast: Secondary | ICD-10-CM

## 2020-05-25 ENCOUNTER — Other Ambulatory Visit: Payer: BC Managed Care – PPO

## 2020-06-06 ENCOUNTER — Other Ambulatory Visit: Payer: BC Managed Care – PPO

## 2020-06-06 DIAGNOSIS — U071 COVID-19: Secondary | ICD-10-CM | POA: Diagnosis not present

## 2020-06-07 ENCOUNTER — Telehealth: Payer: Self-pay | Admitting: Oncology

## 2020-06-07 NOTE — Telephone Encounter (Signed)
Called to Discuss with patient about Covid symptoms and the use of the monoclonal antibody infusion for those with mild to moderate Covid symptoms and at a high risk of hospitalization.     Pt is qualified for this infusion due to co-morbid conditions and/or a member of an at-risk group.     Patient Active Problem List   Diagnosis Date Noted  . Insufficiency fracture of tibia 05/04/2020  . Complex tear of medial meniscus of left knee as current injury 04/27/2020  . S/P cesarean section 05/11/2015    Patient declines infusion at this time. Symptoms tier reviewed as well as criteria for ending isolation. Preventative practices reviewed. Patient verbalized understanding.    Patient advised to call back if he/she decides that he/she does want to get infusion. Callback number to the infusion center given. Patient advised to go to Urgent care or ED with severe symptoms.   Faythe Casa, NP 06/07/2020 1:14 PM

## 2020-06-16 ENCOUNTER — Inpatient Hospital Stay: Payer: BC Managed Care – PPO | Admitting: Orthopaedic Surgery

## 2020-06-28 ENCOUNTER — Other Ambulatory Visit: Payer: BC Managed Care – PPO

## 2020-06-28 ENCOUNTER — Inpatient Hospital Stay: Admission: RE | Admit: 2020-06-28 | Payer: BC Managed Care – PPO | Source: Ambulatory Visit

## 2020-10-12 ENCOUNTER — Other Ambulatory Visit: Payer: Self-pay

## 2020-10-12 ENCOUNTER — Ambulatory Visit
Admission: RE | Admit: 2020-10-12 | Discharge: 2020-10-12 | Disposition: A | Discharge status: Home / Self Care | Payer: BC Managed Care – PPO | Source: Ambulatory Visit | Attending: Obstetrics and Gynecology | Admitting: Obstetrics and Gynecology

## 2020-10-12 ENCOUNTER — Ambulatory Visit: Admission: RE | Admit: 2020-10-12 | Payer: BC Managed Care – PPO | Source: Ambulatory Visit

## 2020-10-12 DIAGNOSIS — R922 Inconclusive mammogram: Secondary | ICD-10-CM | POA: Diagnosis not present

## 2020-10-12 DIAGNOSIS — R928 Other abnormal and inconclusive findings on diagnostic imaging of breast: Secondary | ICD-10-CM

## 2021-01-23 ENCOUNTER — Other Ambulatory Visit: Payer: Self-pay

## 2021-01-23 ENCOUNTER — Encounter (HOSPITAL_COMMUNITY): Payer: Self-pay | Admitting: *Deleted

## 2021-01-23 ENCOUNTER — Emergency Department (HOSPITAL_COMMUNITY): Payer: BC Managed Care – PPO

## 2021-01-23 ENCOUNTER — Emergency Department (HOSPITAL_COMMUNITY)
Admission: EM | Admit: 2021-01-23 | Discharge: 2021-01-23 | Disposition: A | Discharge status: Home / Self Care | Payer: BC Managed Care – PPO | Attending: Emergency Medicine | Admitting: Emergency Medicine

## 2021-01-23 DIAGNOSIS — U071 COVID-19: Secondary | ICD-10-CM | POA: Insufficient documentation

## 2021-01-23 DIAGNOSIS — R0602 Shortness of breath: Secondary | ICD-10-CM | POA: Diagnosis not present

## 2021-01-23 LAB — CBC WITH DIFFERENTIAL/PLATELET
Abs Immature Granulocytes: 0.02 10*3/uL (ref 0.00–0.07)
Basophils Absolute: 0.1 10*3/uL (ref 0.0–0.1)
Basophils Relative: 2 %
Eosinophils Absolute: 0.1 10*3/uL (ref 0.0–0.5)
Eosinophils Relative: 2 %
HCT: 44.5 % (ref 36.0–46.0)
Hemoglobin: 14.7 g/dL (ref 12.0–15.0)
Immature Granulocytes: 1 %
Lymphocytes Relative: 38 %
Lymphs Abs: 1.6 10*3/uL (ref 0.7–4.0)
MCH: 30.6 pg (ref 26.0–34.0)
MCHC: 33 g/dL (ref 30.0–36.0)
MCV: 92.7 fL (ref 80.0–100.0)
Monocytes Absolute: 0.6 10*3/uL (ref 0.1–1.0)
Monocytes Relative: 13 %
Neutro Abs: 1.9 10*3/uL (ref 1.7–7.7)
Neutrophils Relative %: 44 %
Platelets: 228 10*3/uL (ref 150–400)
RBC: 4.8 MIL/uL (ref 3.87–5.11)
RDW: 12 % (ref 11.5–15.5)
WBC: 4.3 10*3/uL (ref 4.0–10.5)
nRBC: 0 % (ref 0.0–0.2)

## 2021-01-23 LAB — BASIC METABOLIC PANEL
Anion gap: 7 (ref 5–15)
BUN: 12 mg/dL (ref 6–20)
CO2: 26 mmol/L (ref 22–32)
Calcium: 8.8 mg/dL — ABNORMAL LOW (ref 8.9–10.3)
Chloride: 102 mmol/L (ref 98–111)
Creatinine, Ser: 0.82 mg/dL (ref 0.44–1.00)
GFR, Estimated: >60 mL/min (ref >60)
Glucose, Bld: 115 mg/dL — ABNORMAL HIGH (ref 70–99)
Potassium: 3.4 mmol/L — ABNORMAL LOW (ref 3.5–5.1)
Sodium: 135 mmol/L (ref 135–145)

## 2021-01-23 LAB — I-STAT BETA HCG BLOOD, ED (MC, WL, AP ONLY): I-stat hCG, quantitative: <5 m[IU]/mL (ref <5)

## 2021-01-23 LAB — TROPONIN I (HIGH SENSITIVITY): Troponin I (High Sensitivity): <2 ng/L (ref <18)

## 2021-01-23 NOTE — ED Triage Notes (Signed)
Covid positive x 4 days, c/o shortness  of breath

## 2021-01-23 NOTE — Discharge Instructions (Signed)
Continue to quarantine until you are asymptomatic, after that you may return back to work.  You can take Tylenol and ibuprofen as needed for any body aches. Work-up is reassuring, no signs of heart attack.  We suspect the chest tightness and shortness of breath is likely secondary to the COVID-19 virus.  If things change or worsen please return back to the ED for further evaluation.

## 2021-01-23 NOTE — ED Provider Notes (Signed)
[facility] EMERGENCY DEPARTMENT Provider Note   CSN: [id number] Arrival date & time: 01/23/21  1544     History Chief Complaint  Patient presents with   Covid Positive    Tamara Hamilton is a 45 y.o. female.  HPI  Patient presents with chest pain tightness and shortness of breath.  Happened earlier today while she was working on the computer, lasted for about 15 minutes.  No aggravating factors, not sure about alleviating factors.  No history of pain like this before.  She is COVID-positive x4 days.  States that the shortness of breath came on suddenly and then improved.  No history of asthma or COPD.  No recent travel, no recent surgeries, not on oral birth control.  Past Medical History:  Diagnosis Date   Anemia    Fibroid     Patient Active Problem List   Diagnosis Date Noted   Insufficiency fracture of tibia 05/04/2020   Complex tear of medial meniscus of left knee as current injury 04/27/2020   S/P cesarean section 05/11/2015    Past Surgical History:  Procedure Laterality Date   CESAREAN SECTION     CESAREAN SECTION WITH BILATERAL TUBAL LIGATION  05/11/2015   Procedure: CESAREAN SECTION;  Surgeon: Cheri Fowler, MD;  Location: [facility];  Service: Obstetrics;;  Unable to perform BTL due to significaqnt scar tissue     OB History     Gravida  2   Para  2   Term  1   Preterm  1   AB  0   Living  1      SAB  0   IAB  0   Ectopic  0   Multiple  0   Live Births  1           No family history on file.  Social History   Tobacco Use   Smoking status: Never   Smokeless tobacco: Never  Substance Use Topics   Alcohol use: No   Drug use: No    Home Medications Prior to Admission medications   Medication Sig Start Date End Date Taking? Authorizing Provider  Diclofenac Sodium (PENNSAID) 2 % SOLN Place 1 application onto the skin 2 (two) times daily. Patient not taking: Reported on 04/20/2020 03/31/20   Gregor Hams, MD  ibuprofen  (ADVIL,MOTRIN) 600 MG tablet Take 1 tablet (600 mg total) by mouth every 6 (six) hours. 05/13/15   Meisinger, Sherren Mocha, MD    Allergies    Penicillins  Review of Systems   Review of Systems  Constitutional:  Positive for chills. Negative for fever.  Respiratory:  Positive for shortness of breath.   Cardiovascular:  Positive for chest pain.  Musculoskeletal:  Positive for myalgias.   Physical Exam Updated Vital Signs BP (!) 155/86 (BP Location: Right Arm)   Pulse 75   Temp 98.4 F (36.9 C) (Oral)   Resp 16   SpO2 98%   Physical Exam Vitals and nursing note reviewed. Exam conducted with a chaperone present.  Constitutional:      General: She is not in acute distress.    Appearance: Normal appearance.  HENT:     Head: Normocephalic and atraumatic.  Eyes:     General: No scleral icterus.    Extraocular Movements: Extraocular movements intact.     Pupils: Pupils are equal, round, and reactive to light.  Cardiovascular:     Rate and Rhythm: Normal rate and regular rhythm.     Heart sounds: No  murmur heard.   No gallop.  Pulmonary:     Effort: Pulmonary effort is normal. No respiratory distress.     Breath sounds: Normal breath sounds. No wheezing.  Skin:    Coloration: Skin is not jaundiced.  Neurological:     Mental Status: She is alert. Mental status is at baseline.     Coordination: Coordination normal.    ED Results / Procedures / Treatments   Labs (all labs ordered are listed, but only abnormal results are displayed) Labs Reviewed - No data to display  EKG None  Radiology No results found.  Procedures Procedures   Medications Ordered in ED Medications - No data to display  ED Course  I have reviewed the triage vital signs and the nursing notes.  Pertinent labs & imaging results that were available during my care of the patient were reviewed by me and considered in my medical decision making (see chart for details).  Clinical Course as of 01/23/21 1846   Mon Jan 23, 2021  1741 I-Stat beta hCG blood, ED Doubt ectopic pregnancy [HS]  1756 CBC with Differential No leukocytosis, no anemia [HS]  1819 Went to check on the patient, he was acting agitated to the lab tech and kept pulling out the IV she was trying to start.  He is stating that "do not blame me if I pee on your dog". [HS]  [id number] Basic metabolic panel(!) No electrolyte derangement, no anion gap, no AKI [HS]  1846 Troponin I (High Sensitivity) His troponin is low, lower suspicion for ACS.  Do not think we need to do a second troponin. [HS]    Clinical Course User Index [HS] Sherrill Raring, PA-C   MDM Rules/Calculators/A&P                           Vitals are stable, although patient is mildly hypertensive.  She did have chest tightness  associated with shortness of breath.  We will also do chest x-ray to evaluate for possible pneumonia.  Suspicion for ACS is low given her age, lack of history and risk factors, but will get a troponin.  We will also check basic labs.  High suspicion that these are due to COVID state, but will rule out other emergent pathology.  On reevaluation, patient is resting comfortably.  She is not having any chest pain or shortness of breath.  Lab work-up is reassuring, I doubt ACS.  Considered PE, but she is not tachycardic, no leg swelling, minimal risk factors.  I doubt PE.  I suspect most of her COVID symptoms are due to COVID, advised Tylenol ibuprofen as needed for pain and follow-up with her outpatient primary care doctor as needed.  Return precautions discussed, patient verbalized understanding and agreement with the plan.  Final Clinical Impression(s) / ED Diagnoses Final diagnoses:  None    Rx / DC Orders ED Discharge Orders     None        Sherrill Raring, [place] 01/23/21 1847    Margette Fast, MD 01/24/21 1158

## 2021-01-29 DIAGNOSIS — M79674 Pain in right toe(s): Secondary | ICD-10-CM | POA: Diagnosis not present

## 2021-01-29 DIAGNOSIS — L03031 Cellulitis of right toe: Secondary | ICD-10-CM | POA: Diagnosis not present

## 2021-01-29 DIAGNOSIS — L089 Local infection of the skin and subcutaneous tissue, unspecified: Secondary | ICD-10-CM | POA: Diagnosis not present

## 2021-04-14 DIAGNOSIS — E559 Vitamin D deficiency, unspecified: Secondary | ICD-10-CM | POA: Diagnosis not present

## 2021-04-14 DIAGNOSIS — R051 Acute cough: Secondary | ICD-10-CM | POA: Diagnosis not present

## 2021-04-14 DIAGNOSIS — Z13220 Encounter for screening for lipoid disorders: Secondary | ICD-10-CM | POA: Diagnosis not present

## 2021-04-14 DIAGNOSIS — Z0000 Encounter for general adult medical examination without abnormal findings: Secondary | ICD-10-CM | POA: Diagnosis not present

## 2021-05-24 DIAGNOSIS — Z6835 Body mass index (BMI) 35.0-35.9, adult: Secondary | ICD-10-CM | POA: Diagnosis not present

## 2021-05-24 DIAGNOSIS — Z1231 Encounter for screening mammogram for malignant neoplasm of breast: Secondary | ICD-10-CM | POA: Diagnosis not present

## 2021-05-24 DIAGNOSIS — Z1389 Encounter for screening for other disorder: Secondary | ICD-10-CM | POA: Diagnosis not present

## 2021-05-24 DIAGNOSIS — R208 Other disturbances of skin sensation: Secondary | ICD-10-CM | POA: Diagnosis not present

## 2021-05-24 DIAGNOSIS — Z01419 Encounter for gynecological examination (general) (routine) without abnormal findings: Secondary | ICD-10-CM | POA: Diagnosis not present

## 2021-05-24 DIAGNOSIS — Z130 Encounter for screening for diseases of the blood and blood-forming organs and certain disorders involving the immune mechanism: Secondary | ICD-10-CM | POA: Diagnosis not present

## 2022-05-02 DIAGNOSIS — Z0000 Encounter for general adult medical examination without abnormal findings: Secondary | ICD-10-CM | POA: Diagnosis not present

## 2022-05-02 DIAGNOSIS — Z6838 Body mass index (BMI) 38.0-38.9, adult: Secondary | ICD-10-CM | POA: Diagnosis not present

## 2022-05-02 DIAGNOSIS — R635 Abnormal weight gain: Secondary | ICD-10-CM | POA: Diagnosis not present

## 2022-05-02 DIAGNOSIS — E559 Vitamin D deficiency, unspecified: Secondary | ICD-10-CM | POA: Diagnosis not present

## 2022-05-02 DIAGNOSIS — Z13220 Encounter for screening for lipoid disorders: Secondary | ICD-10-CM | POA: Diagnosis not present

## 2022-05-21 DIAGNOSIS — R051 Acute cough: Secondary | ICD-10-CM | POA: Diagnosis not present

## 2022-05-21 DIAGNOSIS — J101 Influenza due to other identified influenza virus with other respiratory manifestations: Secondary | ICD-10-CM | POA: Diagnosis not present

## 2022-05-21 DIAGNOSIS — Z03818 Encounter for observation for suspected exposure to other biological agents ruled out: Secondary | ICD-10-CM | POA: Diagnosis not present

## 2022-06-20 DIAGNOSIS — Z6838 Body mass index (BMI) 38.0-38.9, adult: Secondary | ICD-10-CM | POA: Diagnosis not present

## 2022-06-20 DIAGNOSIS — E669 Obesity, unspecified: Secondary | ICD-10-CM | POA: Diagnosis not present

## 2022-07-16 DIAGNOSIS — Z01419 Encounter for gynecological examination (general) (routine) without abnormal findings: Secondary | ICD-10-CM | POA: Diagnosis not present

## 2022-07-16 DIAGNOSIS — Z130 Encounter for screening for diseases of the blood and blood-forming organs and certain disorders involving the immune mechanism: Secondary | ICD-10-CM | POA: Diagnosis not present

## 2022-07-16 DIAGNOSIS — Z6837 Body mass index (BMI) 37.0-37.9, adult: Secondary | ICD-10-CM | POA: Diagnosis not present

## 2022-07-16 DIAGNOSIS — Z1389 Encounter for screening for other disorder: Secondary | ICD-10-CM | POA: Diagnosis not present

## 2022-09-20 IMAGING — MR MR KNEE*L* W/O CM
7 series · 40 of 40 positions shown · non-contrast
Comparison: None.

CLINICAL DATA: Left knee pain for 2 months.

EXAM:
MRI OF THE LEFT KNEE WITHOUT CONTRAST
TECHNIQUE: Multiplanar, multisequence MR imaging of the knee was performed. No
intravenous contrast was administered.

[Series 5: T2 fat-sat · axial · 4.0mm · 0.53mm/px · z∈[-81,+64]mm · 6 of 30 slices shown (1 of 3)]
[im 1/30]
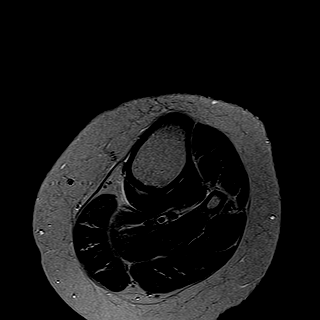
[im 6/30]
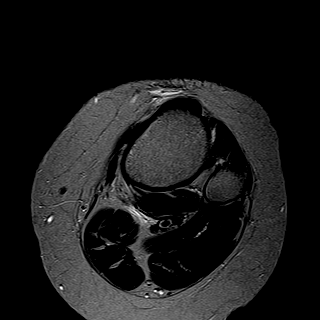
[im 12/30]
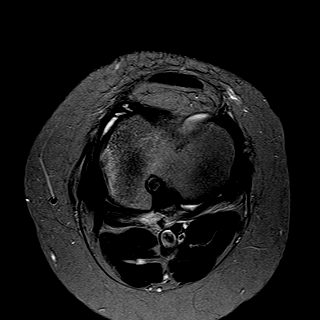
[im 18/30]
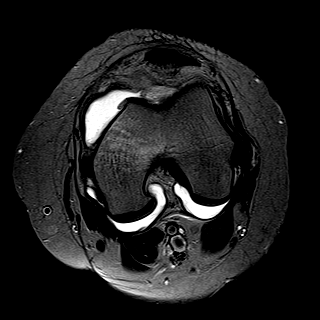
[im 24/30]
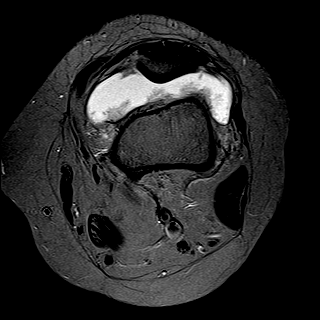
[im 30/30]
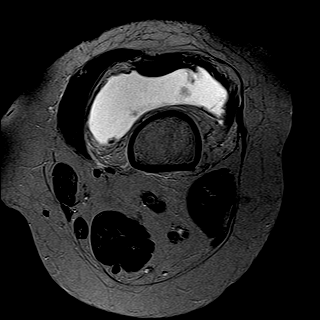

[Series 6: T1 · coronal · 4.0mm · 0.62mm/px · 6 of 32 slices shown]
[im 1/32]
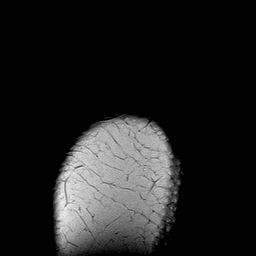
[im 7/32]
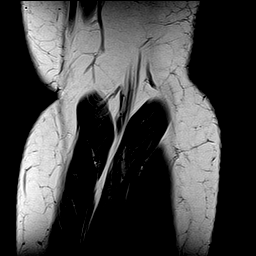
[im 13/32]
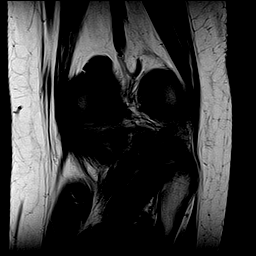
[im 19/32]
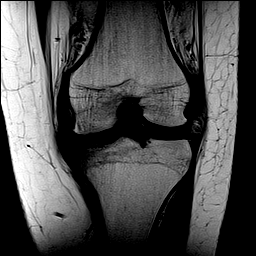
[im 25/32]
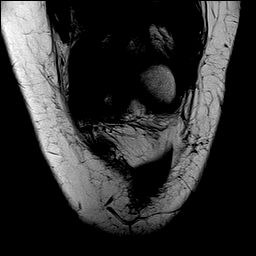
[im 32/32]
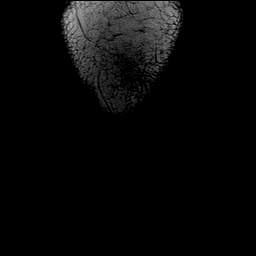

[Series 7: T2 fat-sat · coronal · 4.0mm · 0.62mm/px · 6 of 32 slices shown (2 of 3)]
[im 1/32]
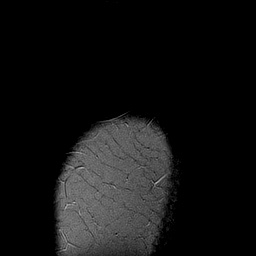
[im 7/32]
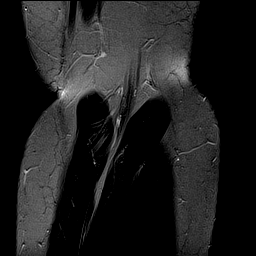
[im 13/32]
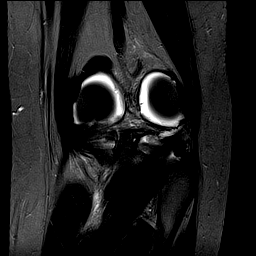
[im 19/32]
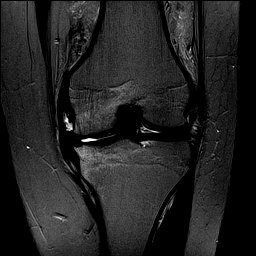
[im 25/32]
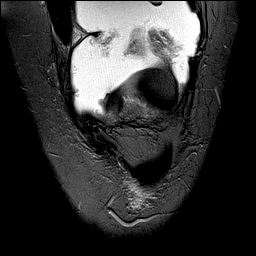
[im 32/32]
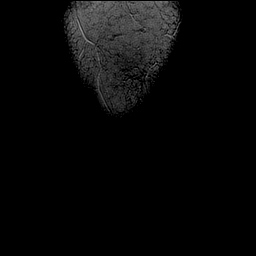

[Series 8: PD fat-sat · sagittal · 3.0mm · 0.62mm/px · 6 of 30 slices shown (1 of 3)]
[im 1/30]
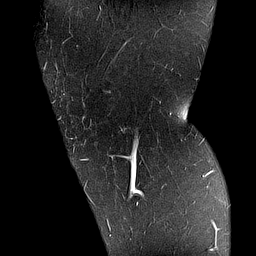
[im 6/30]
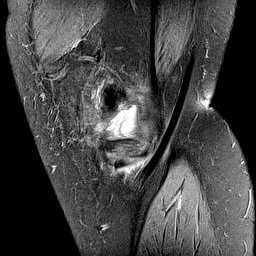
[im 12/30]
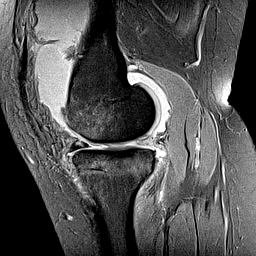
[im 18/30]
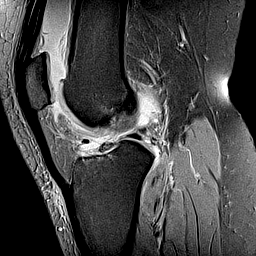
[im 24/30]
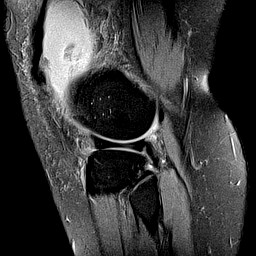
[im 30/30]
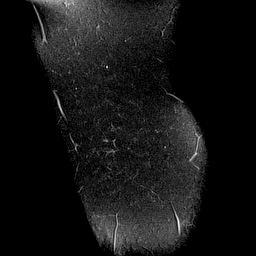

[Series 9: PD fat-sat · coronal · 4.0mm · 0.62mm/px · 6 of 32 slices shown (2 of 3)]
[im 1/32]
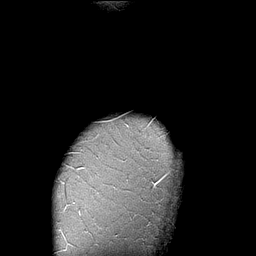
[im 7/32]
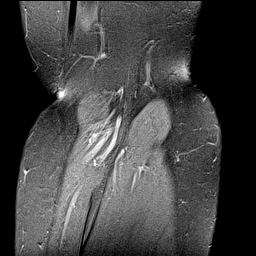
[im 13/32]
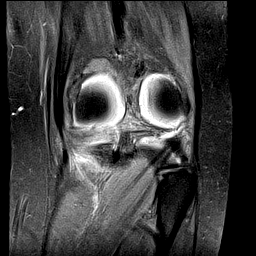
[im 19/32]
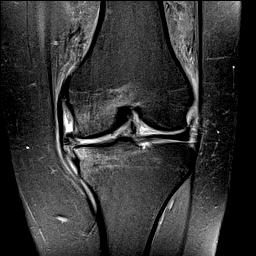
[im 25/32]
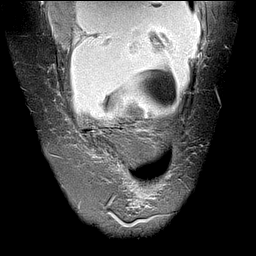
[im 32/32]
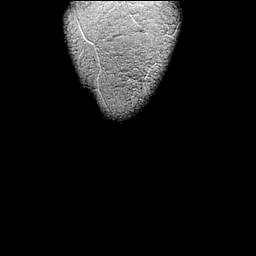

[Series 10: T2 fat-sat · sagittal · 3.0mm · 0.62mm/px · 6 of 30 slices shown (3 of 3)]
[im 1/30]
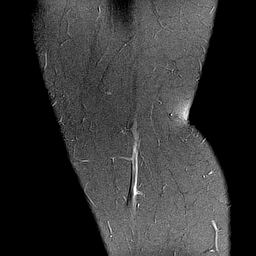
[im 6/30]
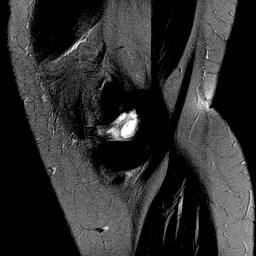
[im 12/30]
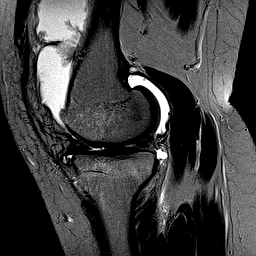
[im 18/30]
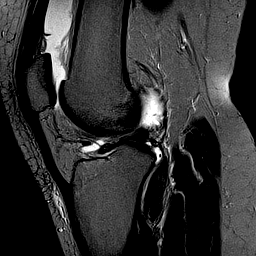
[im 24/30]
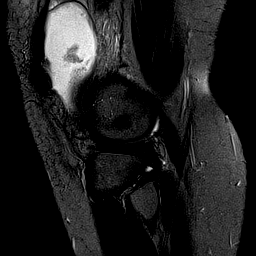
[im 30/30]
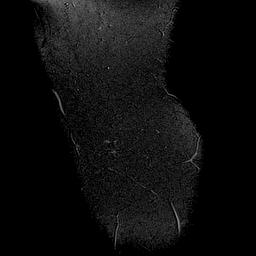

[Series 11: PD fat-sat · coronal · 2.0mm · 0.62mm/px · 4 of 19 slices shown (3 of 3)]
[im 1/19]
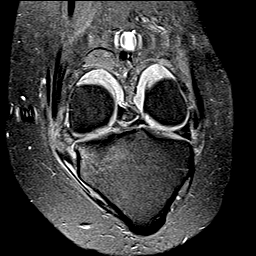
[im 7/19]
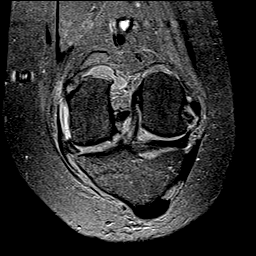
[im 13/19]
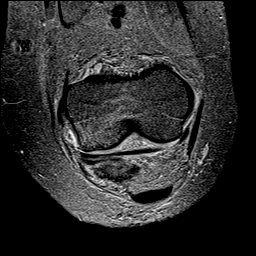
[im 19/19]
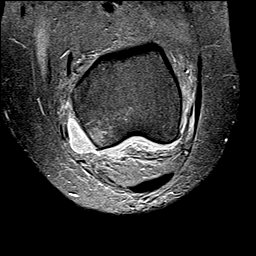

[40 of 40 positions shown; findings below may reference images not displayed]

FINDINGS: MENISCI

Medial: Large radial tear of the posterior horn of the medial
meniscus with peripheral meniscal extrusion. Complex tear of the
body of the medial meniscus.

Lateral: Intact.

LIGAMENTS

Cruciates: ACL and PCL are intact.

Collaterals: Medial collateral ligament is intact. Lateral
collateral ligament complex is intact.

CARTILAGE

Patellofemoral: Cartilage fissuring of the lateral patellar facet.
Partial-thickness cartilage loss of the trochlear groove.

Medial: High-grade partial-thickness cartilage loss of the medial
femorotibial compartment with subchondral reactive marrow edema.

Lateral:  No chondral defect.

JOINT: Large joint effusion. Normal Paokara Lembcke. No plical
thickening.

POPLITEAL FOSSA: Popliteus tendon is intact. No Baker's cyst.

EXTENSOR MECHANISM: Intact quadriceps tendon. Intact patellar
tendon. Intact lateral patellar retinaculum. Intact medial patellar
retinaculum. Intact MPFL.

BONES: No aggressive osseous lesion. No fracture or dislocation.

Other: No fluid collection or hematoma. Muscles are normal.
IMPRESSION: 1. Large radial tear of the posterior horn of the medial meniscus
with peripheral meniscal extrusion. Complex tear of the body of the
medial meniscus.
2. High-grade partial-thickness cartilage loss of the medial
femorotibial compartment with subchondral reactive marrow edema.
3. Cartilage fissuring of the lateral patellar facet.
Partial-thickness cartilage loss of the trochlear groove.
4. Large joint effusion.

## 2022-10-05 DIAGNOSIS — Z3041 Encounter for surveillance of contraceptive pills: Secondary | ICD-10-CM | POA: Diagnosis not present

## 2022-10-05 DIAGNOSIS — Z1231 Encounter for screening mammogram for malignant neoplasm of breast: Secondary | ICD-10-CM | POA: Diagnosis not present

## 2022-12-25 DIAGNOSIS — H9313 Tinnitus, bilateral: Secondary | ICD-10-CM | POA: Diagnosis not present

## 2022-12-25 DIAGNOSIS — H9190 Unspecified hearing loss, unspecified ear: Secondary | ICD-10-CM | POA: Diagnosis not present

## 2022-12-25 DIAGNOSIS — R634 Abnormal weight loss: Secondary | ICD-10-CM | POA: Diagnosis not present

## 2023-04-17 DIAGNOSIS — H9313 Tinnitus, bilateral: Secondary | ICD-10-CM | POA: Diagnosis not present

## 2023-04-17 DIAGNOSIS — H903 Sensorineural hearing loss, bilateral: Secondary | ICD-10-CM | POA: Diagnosis not present

## 2023-05-09 DIAGNOSIS — E559 Vitamin D deficiency, unspecified: Secondary | ICD-10-CM | POA: Diagnosis not present

## 2023-05-09 DIAGNOSIS — Z13220 Encounter for screening for lipoid disorders: Secondary | ICD-10-CM | POA: Diagnosis not present

## 2023-05-09 DIAGNOSIS — R5383 Other fatigue: Secondary | ICD-10-CM | POA: Diagnosis not present

## 2023-05-09 DIAGNOSIS — Z0000 Encounter for general adult medical examination without abnormal findings: Secondary | ICD-10-CM | POA: Diagnosis not present

## 2023-06-26 DIAGNOSIS — Z1212 Encounter for screening for malignant neoplasm of rectum: Secondary | ICD-10-CM | POA: Diagnosis not present

## 2023-06-26 DIAGNOSIS — Z1211 Encounter for screening for malignant neoplasm of colon: Secondary | ICD-10-CM | POA: Diagnosis not present

## 2023-07-21 DIAGNOSIS — J029 Acute pharyngitis, unspecified: Secondary | ICD-10-CM | POA: Diagnosis not present

## 2024-03-18 DIAGNOSIS — Z01419 Encounter for gynecological examination (general) (routine) without abnormal findings: Secondary | ICD-10-CM | POA: Diagnosis not present

## 2024-06-07 ENCOUNTER — Ambulatory Visit (HOSPITAL_COMMUNITY): Payer: Self-pay
